# Patient Record
Sex: Female | Born: 1970 | Race: White | Hispanic: Yes | Marital: Married | Smoking: Never smoker
Health system: Southern US, Community
[De-identification: ages and names within clinical notes are randomized; demographics above are authoritative.]

## PROBLEM LIST (undated history)

## (undated) DIAGNOSIS — Z8742 Personal history of other diseases of the female genital tract: Secondary | ICD-10-CM

## (undated) DIAGNOSIS — K7581 Nonalcoholic steatohepatitis (NASH): Secondary | ICD-10-CM

## (undated) DIAGNOSIS — F32A Depression, unspecified: Secondary | ICD-10-CM

## (undated) DIAGNOSIS — R87612 Low grade squamous intraepithelial lesion on cytologic smear of cervix (LGSIL): Secondary | ICD-10-CM

## (undated) DIAGNOSIS — F329 Major depressive disorder, single episode, unspecified: Secondary | ICD-10-CM

## (undated) DIAGNOSIS — N871 Moderate cervical dysplasia: Secondary | ICD-10-CM

## (undated) HISTORY — PX: TUBAL LIGATION: SHX77

## (undated) HISTORY — DX: Moderate cervical dysplasia: N87.1

## (undated) HISTORY — DX: Personal history of other diseases of the female genital tract: Z87.42

## (undated) HISTORY — DX: Nonalcoholic steatohepatitis (NASH): K75.81

## (undated) HISTORY — PX: TONSILLECTOMY AND ADENOIDECTOMY: SUR1326

## (undated) HISTORY — DX: Depression, unspecified: F32.A

## (undated) HISTORY — DX: Major depressive disorder, single episode, unspecified: F32.9

## (undated) HISTORY — PX: CERVICAL BIOPSY  W/ LOOP ELECTRODE EXCISION: SUR135

## (undated) HISTORY — DX: Low grade squamous intraepithelial lesion on cytologic smear of cervix (LGSIL): R87.612

---

## 2012-05-07 ENCOUNTER — Other Ambulatory Visit: Payer: Self-pay | Admitting: Obstetrics & Gynecology

## 2012-05-07 ENCOUNTER — Encounter: Payer: Self-pay | Admitting: Obstetrics and Gynecology

## 2012-05-07 DIAGNOSIS — Z1231 Encounter for screening mammogram for malignant neoplasm of breast: Secondary | ICD-10-CM

## 2012-06-03 ENCOUNTER — Encounter: Payer: Self-pay | Admitting: Obstetrics & Gynecology

## 2012-06-03 ENCOUNTER — Ambulatory Visit (HOSPITAL_COMMUNITY)
Admission: RE | Admit: 2012-06-03 | Discharge: 2012-06-03 | Disposition: A | Discharge status: Home / Self Care | Payer: Self-pay | Source: Ambulatory Visit | Attending: Obstetrics & Gynecology | Admitting: Obstetrics & Gynecology

## 2012-06-03 DIAGNOSIS — Z1231 Encounter for screening mammogram for malignant neoplasm of breast: Secondary | ICD-10-CM | POA: Insufficient documentation

## 2012-06-24 ENCOUNTER — Ambulatory Visit (INDEPENDENT_AMBULATORY_CARE_PROVIDER_SITE_OTHER): Payer: Self-pay | Admitting: Obstetrics & Gynecology

## 2012-06-24 ENCOUNTER — Encounter: Payer: Self-pay | Admitting: Obstetrics & Gynecology

## 2012-06-24 VITALS — BP 147/89 | HR 88 | Temp 99.0°F | Resp 12 | Ht 62.0 in | Wt 182.0 lb

## 2012-06-24 DIAGNOSIS — Z01419 Encounter for gynecological examination (general) (routine) without abnormal findings: Secondary | ICD-10-CM

## 2012-06-24 DIAGNOSIS — Z0000 Encounter for general adult medical examination without abnormal findings: Secondary | ICD-10-CM

## 2012-06-24 NOTE — Progress Notes (Signed)
Patient ID: Latoya [name], female   DOB: 11/27/1970, 41 y.o.   MRN: [id number] Subjective:    Latoya [name] is a 41 y.o. female who presents for an annual exam. She complains of PMDD symptoms. She denies SI or HI. The patient is sexually active. GYN screening history: last pap: was normal. The patient wears seatbelts: yes. The patient participates in regular exercise: not asked. Has the patient ever been transfused or tattooed?: no. The patient reports that there is not domestic violence in her life.   Menstrual History: OB History    Grav Para Term Preterm Abortions TAB SAB Ect Mult Living                  Menarche age: 24 Patient's last menstrual period was 05/31/2012.    The following portions of the patient's history were reviewed and updated as appropriate: allergies, current medications, past family history, past medical history, past social history, past surgical history and problem list.  Review of Systems A comprehensive review of systems was negative.  Her mammogram was in 8/13   Objective:    BP 147/89  Pulse 88  Temp 99 F (37.2 C) (Oral)  Resp 12  Ht 5' 2"  (1.575 m)  Wt 182 lb (82.555 kg)  BMI 33.29 kg/m2  LMP 05/31/2012  General Appearance:    Alert, cooperative, no distress, appears stated age  Head:    Normocephalic, without obvious abnormality, atraumatic  Eyes:    PERRL, conjunctiva/corneas clear, EOM's intact, fundi    benign, both eyes  Ears:    Normal TM's and external ear canals, both ears  Nose:   Nares normal, septum midline, mucosa normal, no drainage    or sinus tenderness  Throat:   Lips, mucosa, and tongue normal; teeth and gums normal  Neck:   Supple, symmetrical, trachea midline, no adenopathy;    thyroid:  no enlargement/tenderness/nodules; no carotid   bruit or JVD  Back:     Symmetric, no curvature, ROM normal, no CVA tenderness  Lungs:     Clear to auscultation bilaterally, respirations unlabored  Chest Wall:    No tenderness or deformity   Heart:    Regular rate and rhythm, S1 and S2 normal, no murmur, rub   or gallop  Breast Exam:    No tenderness, masses, or nipple abnormality  Abdomen:     Soft, non-tender, bowel sounds active all four quadrants,    no masses, no organomegaly  Genitalia:    Normal female without lesion, discharge or tenderness, NSSA, NT, no adnexal masses     Extremities:   Extremities normal, atraumatic, no cyanosis or edema  Pulses:   2+ and symmetric all extremities  Skin:   Skin color, texture, turgor normal, no rashes or lesions  Lymph nodes:   Cervical, supraclavicular, and axillary nodes normal  Neurologic:   CNII-XII intact, normal strength, sensation and reflexes    throughout  .    Assessment:    Healthy female exam.  PMDD   Plan:       Prozac 20 mg q AM pap RTC 2 months for follow up.

## 2012-07-01 ENCOUNTER — Encounter: Payer: Self-pay | Admitting: Obstetrics & Gynecology

## 2012-07-01 DIAGNOSIS — R87612 Low grade squamous intraepithelial lesion on cytologic smear of cervix (LGSIL): Secondary | ICD-10-CM | POA: Insufficient documentation

## 2012-07-01 HISTORY — DX: Low grade squamous intraepithelial lesion on cytologic smear of cervix (LGSIL): R87.612

## 2012-07-02 ENCOUNTER — Telehealth: Payer: Self-pay | Admitting: *Deleted

## 2012-07-02 NOTE — Telephone Encounter (Signed)
Called pt w/[employer] interpreter # [phone].  I informed her of abnormal Pap result and need for colpo. I explained the procedure in detail and answered her questions. She agreed to appt and voiced understanding of all information given.

## 2012-07-02 NOTE — Telephone Encounter (Signed)
Message copied by Langston Reusing on Thu Jul 02, 2012  4:30 PM ------      Message from: Moshe Cipro L      Created: Wed Jul 01, 2012  1:08 PM       Thursday October 10, at 1:45 pm                  ----- Message -----         From: Ronnell Freshwater Katrece Roediger, RN         Sent: 07/01/2012  11:05 AM           To: [facility] Admin Pool            Please schedule colpo and send back to clinical pool. We will call pt. Thanks       ----- Message -----         From: Emily Filbert, MD         Sent: 07/01/2012  10:23 AM           To: [facility] Clinical Pool            She needs to have a colposcopy.

## 2012-07-16 ENCOUNTER — Ambulatory Visit (INDEPENDENT_AMBULATORY_CARE_PROVIDER_SITE_OTHER): Payer: Self-pay | Admitting: Obstetrics & Gynecology

## 2012-07-16 ENCOUNTER — Other Ambulatory Visit (HOSPITAL_COMMUNITY)
Admission: RE | Admit: 2012-07-16 | Discharge: 2012-07-16 | Disposition: A | Discharge status: Home / Self Care | Payer: Self-pay | Source: Ambulatory Visit | Attending: Obstetrics & Gynecology | Admitting: Obstetrics & Gynecology

## 2012-07-16 VITALS — BP 133/84 | HR 101 | Temp 98.1°F | Ht 62.6 in | Wt 184.2 lb

## 2012-07-16 DIAGNOSIS — N871 Moderate cervical dysplasia: Secondary | ICD-10-CM | POA: Insufficient documentation

## 2012-07-16 DIAGNOSIS — Z01812 Encounter for preprocedural laboratory examination: Secondary | ICD-10-CM

## 2012-07-16 DIAGNOSIS — IMO0002 Reserved for concepts with insufficient information to code with codable children: Secondary | ICD-10-CM

## 2012-07-16 DIAGNOSIS — R6889 Other general symptoms and signs: Secondary | ICD-10-CM

## 2012-07-16 NOTE — Progress Notes (Signed)
Subjective:     Patient ID: Latoya [name], female   DOB: 01-19-1971, 41 y.o.   MRN: [id number]  HPI  Pt with h/o abnormal PAP.  Pt presents for colpo.   Review of Systems     Objective:   Physical ExamBP 133/84  Pulse 101  Temp 98.1 F (36.7 C) (Oral)  Ht 5' 2.6" (1.59 m)  Wt 184 lb 3.2 oz (83.553 kg)  BMI 33.05 kg/m2  LMP 06/26/2012  Patient given informed consent, signed copy in the chart, time out was performed.  Placed in lithotomy position. Cervix viewed with speculum and colposcope after application of acetic acid.   Colposcopy adequate?  yes Acetowhite lesions?yes Punctation?yes @ 6:00 Mosaicism? Yes @6 :00 Abnormal vasculature?  Yes @ 6:00 Biopsies?Yes @ 6:00 ECC?no   06/24/12  GYNECOLOGIC CYTOLOGY REPORT Adequacy Reason Satisfactory for evaluation, endocervical/transformation zone component PRESENT. Diagnosis LOW GRADE SQUAMOUS INTRAEPITHELIAL LESION: CIN-1/ HPV (LSIL); HOWEVER, THERE ARE A FEW CELLS SUGGESTIVE OF A HIGHER GRADE LESION.     Assessment:     LGSIL of PAP.  Colpo most consistent for mod to high grade at 6:00. Patient was given post procedure instructions.  She will return for LEEP if bx warrant     Plan:     F/u results of cervical bx.  If mod to high grade rec LEEP  Marco Adelson L. Harraway-Smith, M.D., Cherlynn June

## 2012-07-16 NOTE — Patient Instructions (Signed)
Procedimiento de escisin electroquirrgica con asa (Loop Electrosurgical Excision Procedure) El procedimiento de escisin electroquirrgica con asa es la extirpacin de una porcin de la parte inferior del tero (cuello). El procedimento se realiza cuando hay cambios significativamente anormales en las clulas del cuello del tero. Estos cambios pueden causar cncer si se dejan sin tratar.   El procedimiento mismo slo demora algunos minutos. Generalmente se Personal assistant del mdico. Se considera un procedimiento seguro para aquellas mujeres que desean o tratan de quedar embarazadas. Solo bajo raras circunstancias este procedimiento se realiza estando embarazada.  INFORME A SU MDICO:   Si est embarazada o no tuvo el ltimo perodo menstrual.  Alergias a alimentos o medicamentos.  Todos los [employer] [place], incluyendo vitaminas, hierbas, gotas oftlmicas, medicamentos de [place] y Proofreader.  Uso de corticoides (por va oral o cremas).  Problemas anteriores debido a anestsicos o a medicamentos que [employer] sensibilidad.  Cirugas ginecolgicas previas.  Antecedentes de hemorragias o cogulos sanguneos.  Infecciones recientes o actuales en la vagina (herpes, enfermedades de transmisin sexual).  Otros problemas de [place]. [place].  Infecciones.  Lesiones en la vagina, la vejiga o el recto.  Obstruccin rara en la abertura del cuello que causa problemas durante la menstruacin (estenosis cervical). ANTES DEL PROCEDIMIENTO   No tome aspirina ni anticoagulantes durante la semana previa al procedimiento, o segn le hayan indicado.  Consuma una comida ligera antes del procedimiento.  Consulte a su mdico si debe cambiar o suspender los medicamentos que toma habitualmente.  Le administrarn un analgsico 1  2 horas antes del procedimiento. PROCEDIMIENTO   Se coloca un instrumento (espculo) en la vagina. Esto le permite  al mdico observar el cuello.  Se aplica tintura de yodo para encontrar la zona de las clulas anormales.  Se inyecta un medicamento para adormecer el cuello (anestsico local).   Se pasa electricidad a travs de una delgada asa de alambre que luego se [place] para extirpar (cauterizar) un pequeo segmento de la zona afectada.  Se utiliza una ligera electrocauterizacin para sellar los vasos sanguneos pequeos y Engineer, water.  Aplicarn una pasta en la zona cauterizada para prevenir el sangrado.  La muestra de tejido se enva al laboratorio. Luego, se examina en el microscopio. DESPUS DEL PROCEDIMIENTO   Pdale a alguna persona que la lleve hasta su casa.  Puede sentir un clico moderado a suave.  Puede notar una secrecin vaginal negra por la pasta usada para prevenir el sangrado. Esto es normal.  Observe si tiene un sangrado excesivo. Esto requiere atencin mdica inmediata.  Consulte con su mdico la fecha en que los resultados estarn disponibles. Asegrese de [employer]. Document Released: 06/05/2011 Document Revised: 12/16/2011 [facility] Patient Information 2013 [place].

## 2012-07-22 ENCOUNTER — Telehealth: Payer: Self-pay

## 2012-07-22 NOTE — Telephone Encounter (Signed)
Message copied by Michel Harrow on Wed Jul 22, 2012  4:35 PM ------      Message from: Lavonia Drafts      Created: Wed Jul 22, 2012  1:22 PM       Please schedule pt for LEEP with me.              Thx,      clh-S

## 2012-07-22 NOTE — Telephone Encounter (Signed)
Called pt with [facility] and informed pt of scheduled appt for LEEP procedure/evaluation on 08/10/12 @ 2 pm. Pt stated wanted to know what the procedure was I explained and informed her that she would get an evaluation before the procedure.  Pt stated understanding and had no further questions.

## 2012-08-10 ENCOUNTER — Other Ambulatory Visit (HOSPITAL_COMMUNITY)
Admission: RE | Admit: 2012-08-10 | Discharge: 2012-08-10 | Disposition: A | Discharge status: Home / Self Care | Payer: Self-pay | Source: Ambulatory Visit | Attending: Obstetrics & Gynecology | Admitting: Obstetrics & Gynecology

## 2012-08-10 ENCOUNTER — Encounter: Payer: Self-pay | Admitting: Obstetrics & Gynecology

## 2012-08-10 ENCOUNTER — Ambulatory Visit (INDEPENDENT_AMBULATORY_CARE_PROVIDER_SITE_OTHER): Payer: Self-pay | Admitting: Obstetrics & Gynecology

## 2012-08-10 VITALS — BP 130/86 | HR 94 | Temp 98.5°F | Ht 62.25 in | Wt 178.2 lb

## 2012-08-10 DIAGNOSIS — N871 Moderate cervical dysplasia: Secondary | ICD-10-CM

## 2012-08-10 DIAGNOSIS — N870 Mild cervical dysplasia: Secondary | ICD-10-CM | POA: Insufficient documentation

## 2012-08-10 DIAGNOSIS — Z01812 Encounter for preprocedural laboratory examination: Secondary | ICD-10-CM

## 2012-08-10 HISTORY — DX: Moderate cervical dysplasia: N87.1

## 2012-08-10 MED ORDER — FLUOXETINE HCL 20 MG PO CAPS: 20.0000 mg | ORAL_CAPSULE | Freq: Every day | ORAL | Status: DC

## 2012-08-10 NOTE — Progress Notes (Signed)
Patient ID: Latoya [name], female   DOB: 1971-01-07, 41 y.o.   MRN: [id number] Patient identified, informed consent obtained, signed copy in chart, time out performed.  Pap smear and colposcopy reviewed.   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  Cervix visualized. Paracervical block placed. Hurricaine spray applied  1.5 cm Fisher  size loop used to remove cone of cervix using blend of cut and cautery on LEEP machine.  Edges/Base cauterized with Ball.  Monsel's solution used for hemostasis.  Patient tolerated procedure well. Specimen to pathology. Patient given post procedure instructions.  Follow up in 4 months for repeat pap or as needed.  RTC 2 weeks review result  Delise Simenson 08/10/2012 2:36 PM

## 2012-08-10 NOTE — Patient Instructions (Signed)
Procedimiento de escisin electroquirrgica con asa - Cuidados posteriores  (Loop Electrosurgical Excision Procedure, Care After) Siga estas instrucciones durante las prximas semanas. Estas indicaciones le proporcionan informacin general acerca de cmo deber cuidarse despus del procedimiento. El mdico tambin podr darle instrucciones especficas. El tratamiento ha sido planificado segn las prcticas mdicas actuales, pero en algunos casos pueden ocurrir problemas. Comunquese con el mdico si tiene algn problema o tiene preguntas despus del procedimiento.  INSTRUCCIONES PARA EL CUIDADO EN EL HOGAR   No use tampones, no se d duchas vaginales ni tenga relaciones sexuales durante 2 semanas, o segn lo que le indique su mdico.   Comience con las actividades habituales si no tiene o tiene mnimo de clicos y Teacher, music, excepto que el mdico le indique lo contrario.   Tmese la temperatura si se siente enfermo. Anote la temperatura en un papel e informe a su mdico que tiene fiebre.   Tome todos los medicamentos segn le indic su mdico.   Cumpla con todas las visitas de control y los papanicolau, segn le indique su mdico.  SOLICITE ATENCIN MDICA DE INMEDIATO SI:   Tiene un sangrado ms abundante o que dura ms que el ciclo menstrual normal.   Tiene un sangrado de color rojo brillante.   Elimina cogulos de [place].   Tiene fiebre.   Siente clicos o el dolor no se alivia con Conservation officer, nature.   Siente dolor abdominal que no parece estar relacionado con la misma zona en que sinti los clicos y Conservation officer, historic buildings.   Se siente mareada, dbil o se desmaya.   Comienza a Education officer, environmental al orinar u [employer]   Tiene una secrecin vaginal con mal olor.  ASEGRESE DE QUE:   Comprende estas instrucciones.   Controlar su enfermedad.   Solicitar ayuda de inmediato si no mejora o si empeora.  Document Released: 06/06/2011 Document Revised: 09/12/2011 [facility] Patient Information 2012  [place].

## 2012-08-10 NOTE — Addendum Note (Signed)
Addended by: Woodroe Mode on: 08/10/2012 02:49 PM   Modules accepted: Orders

## 2012-08-24 ENCOUNTER — Encounter: Payer: Self-pay | Admitting: Obstetrics & Gynecology

## 2012-08-24 ENCOUNTER — Ambulatory Visit (INDEPENDENT_AMBULATORY_CARE_PROVIDER_SITE_OTHER): Payer: Self-pay | Admitting: Obstetrics & Gynecology

## 2012-08-24 VITALS — BP 133/81 | HR 87 | Temp 97.0°F | Ht 62.0 in | Wt 179.1 lb

## 2012-08-24 DIAGNOSIS — N871 Moderate cervical dysplasia: Secondary | ICD-10-CM

## 2012-08-24 NOTE — Patient Instructions (Signed)
Cervical Dysplasia Cervical dysplasia is a condition in which a woman has abnormal changes in the cells of her cervix. The cervix is the opening to the uterus (womb) between the vagina and the uterus. These changes are called cervical dysplasia and may be the first signs of cervical cancer. These cells can be taken from the cervix during a Pap test and then looked at under a microscope. With early detection, treatment, and close follow-up care, nearly all cervical dysplasia can be cured. If untreated, the mild to moderate stages of dysplasia often grow more severe.  RISK FACTORS  The following increase the risk for cervical dysplasia.  Having had a sexually transmitted disease, including:  Chlamydia.  Human papilloma virus (HPV).  Becoming sexually active before age 43.  Having had more than 1 sexual partner.  Not using protection, such as condoms, during sexual intercourse, especially with new sexual partners.  Having had cancer of the vagina or vulva.  Having a sexual partner whose previous partner had cancer of the cervix or cervical dysplasia.  Having a sexual partner who has or has had cancer of the penis.  Having a weakened immune system (HIV, organ transplant).  Being the daughter of a woman who took DES (diethylstilbestrol) during pregnancy.  A history of cervical cancer in a woman's sister or mother.  Smoking.  Having had an abnormal Pap test in the past. SYMPTOMS  There are usually no symptoms. If there are symptoms, they may be vague such as:  Abnormal vaginal discharge.  Bleeding between periods or following intercourse.  Bleeding during menopause.  Pain on intercourse (dyspareunia). DIAGNOSIS   The Pap test is the best way of detecting abnormalities of the cervix.  Biopsy (removing a piece of tissue to look at under the microscope) of the cervix when the Pap test is abnormal or when the Pap test is normal, but the cervix looks abnormal. TREATMENT    Catching and treating the changes early with Pap tests can prevent cervical cancer.  Cryotherapy freezes the abnormal cells with a steel tip instrument.  A laser can be used to remove the abnormal cells.  Loop electrocautery excision procedure (LEEP). This procedure uses a heated electrical loop to remove a cone-like portion of the cervix, including the cervical canal.  For more serious cases of cervical dysplasia, the abnormal tissue may be removed surgically by:  A cone biopsy (by cold knife, laser or LEEP). A procedure in which a portion of the center of the cervix with the cervical canal is removed.  The uterus and cervix are removed (hysterectomy). Your caregiver will advise you regarding the need and timing of Pap tests in your follow-up. Women who have been treated for dysplasia should be closely followed with pelvic exams and Pap tests. During the first year following treatment of cervical dysplasia, Pap tests should be done every 3 to 4 months. In the second year, the schedule is every 6 months, or as recommended by your caregiver. See your caregiver for new or worsening problems. HOME CARE INSTRUCTIONS   Follow the instructions and recommendations of your caregiver regarding medicines and follow-up appointments.  Only take over-the-counter or prescription medicines for pain or discomfort as directed by your caregiver.  Cramping and pelvic discomfort may follow cryotherapy. It is not abnormal to have watery discharge for several weeks after.  Laser, cone surgery, cryotherapy or LEEP can cause a bad smelling vaginal discharge. It may also cause vaginal bleeding for a couple weeks following the procedure. The  discharge may be black from the paste used to control bleeding from the cone site. This is normal.  Do not use tampons, have sexual intercourse or douche until your caregiver says it is okay. SEEK MEDICAL CARE IF:   You develop genital warts.  You need a prescription for  pain medicine following your treatment. SEEK IMMEDIATE MEDICAL CARE IF:   Your bleeding is heavier than a normal menstrual period.  You develop bright red bleeding, especially if you have blood clots.  You have a fever.  You have increasing cramps or pain not relieved with medicine.  You are lightheaded, unusually weak, or have fainting spells.  You have abnormal vaginal discharge.  You develop abdominal pain. PREVENTION   The surest way to prevent cervical dysplasia is to abstain from sexual intercourse.  Practice safe sex, use condoms and have only one sex partner who does not have other sex partners.  A Pap test is done to screen for cervical cancer.  The first Pap test should be done at age 32.  Between ages 66 and 78, Pap tests are repeated every 2 years.  Beginning at age 48, you are advised to have a Pap test every 3 years as long as your past 3 Pap tests have been normal.  Some women have medical problems that increase the chance of getting cervical cancer. Talk to your caregiver about these problems. It is especially important to talk to your caregiver if a new problem develops soon after your last Pap test. In these cases, your caregiver may recommend more frequent screening and Pap tests.  The above recommendations are the same for women who have or have not gotten the vaccine for HPV (Human Papillomavirus).  If you had a hysterectomy for a problem that was not a cancer or a condition that could lead to cancer, then you no longer need Pap tests. However, even if you no longer need a Pap test, a regular exam is a good idea to make sure no other problems are starting.   If you are between ages 80 and 5, and you have had normal Pap tests going back 10 years, you no longer need Pap tests. However, even if you no longer need a Pap test, a regular exam is a good idea to make sure no other problems are starting.   If you have had past treatment for cervical cancer or a  condition that could lead to cancer, you need Pap tests and screening for cancer for at least 20 years after your treatment.  If Pap tests have been discontinued, risk factors (such as a new sexual partner) need to be re-assessed to determine if screening should be resumed.  Some women may need screenings more often if they are at high risk for cervical cancer.  Your caregiver may do additional tests including:  Colposcopy. A procedure in which a special microscope magnifies the cells and allows the provider to closely examine the cervix, vagina, and vulva.  Biopsy. A small tissue sample is taken from the cervix, vagina or vulva. This is generally done in your caregivers office.  A cone biopsy (cold knife or laser). A large tissue sample is taken from the cervix. This procedure is usually done in an operating room under a general anesthetic. The cone often removes all abnormal tissue and so may also complete the treatment.  LEEP, also removing a circular portion of the cervix and is done in a doctors office under a local anesthetic.  Now  there is a vaccine, Gardasil, that was developed to prevent the HPV'S that can cause cancer of the cervix and genital warts. It is recommended for females ages 27 to 79. It should not be given to pregnant women until more is known about its effects on the fetus. Not all cancers of the cervix are caused by the HPV. Routine gynecology exams and Pap tests should continue as recommended by your caregiver. Document Released: 09/23/2005 Document Revised: 12/16/2011 Document Reviewed: 09/14/2008 [facility] Patient Information 2013 [place].

## 2012-08-24 NOTE — Progress Notes (Signed)
  [id number] Patient's last menstrual period was 07/24/2012. 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  RTC for pap and cotesting   Latoya [name] 08/24/2012 1:32 PM

## 2013-01-19 ENCOUNTER — Other Ambulatory Visit: Payer: Self-pay | Admitting: Specialist

## 2013-01-19 ENCOUNTER — Ambulatory Visit
Admission: RE | Admit: 2013-01-19 | Discharge: 2013-01-19 | Disposition: A | Discharge status: Home / Self Care | Payer: No Typology Code available for payment source | Source: Ambulatory Visit | Attending: Specialist | Admitting: Specialist

## 2013-01-19 DIAGNOSIS — R7611 Nonspecific reaction to tuberculin skin test without active tuberculosis: Secondary | ICD-10-CM

## 2013-07-28 ENCOUNTER — Other Ambulatory Visit: Payer: Self-pay | Admitting: Obstetrics & Gynecology

## 2013-08-12 ENCOUNTER — Other Ambulatory Visit: Payer: Self-pay | Admitting: Obstetrics and Gynecology

## 2013-08-12 DIAGNOSIS — Z1231 Encounter for screening mammogram for malignant neoplasm of breast: Secondary | ICD-10-CM

## 2013-09-07 ENCOUNTER — Ambulatory Visit (HOSPITAL_COMMUNITY)
Admission: RE | Admit: 2013-09-07 | Discharge: 2013-09-07 | Disposition: A | Discharge status: Home / Self Care | Payer: Self-pay | Source: Ambulatory Visit | Attending: Obstetrics and Gynecology | Admitting: Obstetrics and Gynecology

## 2013-09-07 ENCOUNTER — Encounter (HOSPITAL_COMMUNITY): Payer: Self-pay

## 2013-09-07 ENCOUNTER — Other Ambulatory Visit: Payer: Self-pay | Admitting: Obstetrics and Gynecology

## 2013-09-07 VITALS — BP 112/78 | Temp 98.3°F | Ht 65.0 in | Wt 188.4 lb

## 2013-09-07 DIAGNOSIS — N6314 Unspecified lump in the right breast, lower inner quadrant: Secondary | ICD-10-CM

## 2013-09-07 DIAGNOSIS — Z01419 Encounter for gynecological examination (general) (routine) without abnormal findings: Secondary | ICD-10-CM

## 2013-09-07 DIAGNOSIS — Z1231 Encounter for screening mammogram for malignant neoplasm of breast: Secondary | ICD-10-CM

## 2013-09-07 NOTE — Patient Instructions (Signed)
Taught Latoya [name] how to perform BSE and gave educational materials to take home. Let patient know that her next Pap smear will be due based on the result to today's Pap smear. Referred patient to the [facility] for diagnostic mammogram and possible right breast ultrasound. Appointment scheduled for Friday, September 17, 2013 at 0915. Patient aware of appointment and will be there. Let patient know will follow up with her within the next couple weeks with results for Pap smear by phone. Latoya [name] verbalized understanding.  Latoya [name], Arvil Chaco, RN 12:37 PM

## 2013-09-07 NOTE — Progress Notes (Signed)
No complaints today.  Pap Smear:    Pap smear completed today. Patients last Pap smear was 06/24/2012 at the [facility] and LSIL with a few cells suggestive of a higher grade lesion. Patient had a colposcopy and LEEP for follow up of abnormal Pap smear. Per patient that is the only abnormal Pap smear she has had. Pap smear result above is in EPIC.  Physical exam: Breasts Breasts symmetrical. No skin abnormalities bilateral breasts. No nipple retraction bilateral breasts. No nipple discharge bilateral breasts. No lymphadenopathy. No lumps palpated left breast. Palpated a pea sized lump within the right breast at 4 o'clock 9 cm from the nipple. Patient complained of tenderness when palpated lump. [facility] to [facility] for [facility] and possible right breast ultrasound. Appointment scheduled for Friday, September 17, 2013 at 0915.      Pelvic/Bimanual   Ext Genitalia No lesions, no swelling and no discharge observed on external genitalia.         Vagina Vagina pink and normal texture. No lesions or discharge observed in vagina.          Cervix Cervix is present. Cervix pink and of normal texture. No discharge observed.     Uterus Uterus is present and palpable. Uterus in normal position and normal size.        Adnexae Bilateral ovaries present and palpable. No tenderness on palpation.          Rectovaginal No rectal exam completed today since patient had no rectal complaints. No skin abnormalities observed on exam.

## 2013-09-13 ENCOUNTER — Telehealth (HOSPITAL_COMMUNITY): Payer: Self-pay | Admitting: *Deleted

## 2013-09-13 NOTE — Telephone Encounter (Signed)
Telephoned patient with interpreter Lavon Paganini. Discussed negative pap smear results. Next pap smear due in years. Patient voiced understanding.

## 2013-09-17 ENCOUNTER — Other Ambulatory Visit: Payer: Self-pay

## 2013-10-21 ENCOUNTER — Telehealth (HOSPITAL_COMMUNITY): Payer: Self-pay | Admitting: *Deleted

## 2013-10-21 NOTE — Telephone Encounter (Signed)
Telephoned patient with interpreter Lavon Paganini. Patient will call and reschedule mammogram with [facility].

## 2013-11-03 ENCOUNTER — Ambulatory Visit
Admission: RE | Admit: 2013-11-03 | Discharge: 2013-11-03 | Disposition: A | Discharge status: Home / Self Care | Payer: No Typology Code available for payment source | Source: Ambulatory Visit | Attending: Obstetrics and Gynecology | Admitting: Obstetrics and Gynecology

## 2013-11-03 DIAGNOSIS — N6314 Unspecified lump in the right breast, lower inner quadrant: Secondary | ICD-10-CM

## 2014-08-08 ENCOUNTER — Encounter (HOSPITAL_COMMUNITY): Payer: Self-pay

## 2014-10-07 DIAGNOSIS — Z8742 Personal history of other diseases of the female genital tract: Secondary | ICD-10-CM

## 2014-10-07 HISTORY — DX: Personal history of other diseases of the female genital tract: Z87.42

## 2015-06-20 ENCOUNTER — Other Ambulatory Visit (HOSPITAL_COMMUNITY): Payer: Self-pay | Admitting: *Deleted

## 2015-06-20 DIAGNOSIS — N6310 Unspecified lump in the right breast, unspecified quadrant: Secondary | ICD-10-CM

## 2015-06-22 ENCOUNTER — Ambulatory Visit (HOSPITAL_COMMUNITY)
Admission: RE | Admit: 2015-06-22 | Discharge: 2015-06-22 | Disposition: A | Discharge status: Home / Self Care | Payer: Self-pay | Source: Ambulatory Visit | Attending: Obstetrics and Gynecology | Admitting: Obstetrics and Gynecology

## 2015-06-22 ENCOUNTER — Encounter (HOSPITAL_COMMUNITY): Payer: Self-pay

## 2015-06-22 VITALS — BP 120/74 | Temp 98.2°F | Ht 60.0 in | Wt 190.0 lb

## 2015-06-22 DIAGNOSIS — Z01419 Encounter for gynecological examination (general) (routine) without abnormal findings: Secondary | ICD-10-CM

## 2015-06-22 NOTE — Progress Notes (Signed)
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 is a 44 y.o. female who presents to the [facility] today for clinical breast exam and routine gynecological exam.  Her last mammogram was 10/2013, with recommendations to follow-up in 6 months for a right breast, 9:30 mass that was likely a cyst.  She did not follow-up in 6 months from 10/2013, but is here today for further evaluation.  She has no family history of breast cancer.  She has no breast complaints today. 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  In 08/2012, she had a LEEP, which showed CIN-I.  Her most recent pap was in 09/2013 and was negative and HPV (-).   She has no GYN complaints today.   REVIEW OF SYSTEMS:  Denies any breast tenderness, skin changes, nipple discharge/inversion, or lumps.  Denies any pelvic pain, pressure, or abnormal vaginal bleeding.    ALLERGIES: No Known Allergies  CURRENT MEDICATIONS:  Current Outpatient Prescriptions on File Prior to Encounter  Medication Sig Dispense Refill  . FLUoxetine (PROZAC) 20 MG capsule Take 1 capsule (20 mg total) by mouth daily. 30 capsule 2  . Multiple Vitamins-Minerals (MULTIVITAMIN WITH MINERALS) tablet Take 1 tablet by mouth daily.     No current facility-administered medications on file prior to encounter.     PHYSICAL EXAM:  Vitals:  Filed Vitals:   06/22/15 1504  BP: 120/74  Temp: 98.2 F (36.8 C)    General: Well-nourished, well-appearing female in no acute distress.  She is unaccompanied in clinic today.  Rolena Infante, LPN and Tamsen Snider, Spanish language interpreter were present during physical exam for this patient.  Breasts: Bilateral breasts exposed and observed with patient standing (arms at side, arms on hips, arms on hips flexed  forward, and arms over head).  No gross abnormalities including breast skin puckering or dimpling noted on observation.  Breasts symmetrical without evidence of skin redness, thickening, or peau d'orange appearance. No nipple retraction or nipple discharge noted bilaterally.  No breast nodularity palpated in bilateral breasts.  Right breast: The previous area of identified concern on mammogram (9:30, 6 cm from nipple) has no palpable abnormality on exam today.  Axillary lymph nodes: No axillary lymphadenopathy bilaterally.   GU:  -External genitalia: No lesions, swelling, or discharge. Even hair distribution as expected.  -Vagina: Pink, moist. No lesions or discharge noted in vaginal canal.  -Cervix: Cervix pink with evidence of previous LEEP. Cervical os patent. Small amount of clear cervical discharge noted.  -Uterus: Bimanual exam demonstrates no uterine mass or tenderness on palpation. Uterus in normal position and normal size.  -Adnexae: Bimanual exam demonstrates no ovarian masses or tenderness on palpation.  -Rectovaginal: No lesions noted to rectum. Rectal tone intact.  No masses or nodularity palpated by bimanual rectovaginal exam.     ASSESSMENT & PLAN:   1. Breast cancer screening: [name] has no palpable breast abnormalities on her clinical breast exam today.  She will receive her diagnostic mammogram as scheduled.  She will be contacted by the imaging center for results of the mammogram. She was given instructions and educational materials regarding breast self-awareness. [name] is aware of this plan and agrees with it.   2. Cervical cancer screening: [name] has a normal pelvic exam today.  A pap smear was completed today per protocol.  She tolerated the procedure without complaints.  She will be contacted by one of our [place] in the next few weeks to review the results of the pap smear with the patient.    [name] was encouraged to ask questions and all questions were answered to her satisfaction.    Mike Craze, NP [employer]  [phone]

## 2015-06-27 ENCOUNTER — Other Ambulatory Visit (HOSPITAL_COMMUNITY): Payer: Self-pay | Admitting: Obstetrics and Gynecology

## 2015-06-27 ENCOUNTER — Ambulatory Visit
Admission: RE | Admit: 2015-06-27 | Discharge: 2015-06-27 | Disposition: A | Discharge status: Home / Self Care | Payer: No Typology Code available for payment source | Source: Ambulatory Visit | Attending: Obstetrics and Gynecology | Admitting: Obstetrics and Gynecology

## 2015-06-27 DIAGNOSIS — N6310 Unspecified lump in the right breast, unspecified quadrant: Secondary | ICD-10-CM

## 2015-06-28 LAB — CYTOLOGY - PAP

## 2015-07-18 ENCOUNTER — Telehealth (HOSPITAL_COMMUNITY): Payer: Self-pay | Admitting: *Deleted

## 2015-07-18 NOTE — Telephone Encounter (Signed)
Telephoned patient at home # and discussed negative pap smear results. HPV was negative. Next pap smear due in one year due to history of abnormal pap smears. Patient voiced understanding. Used interpreter Lavon Paganini.

## 2015-12-06 ENCOUNTER — Telehealth: Payer: Self-pay

## 2015-12-06 NOTE — Telephone Encounter (Signed)
Could not reach. Calling to see if wants to come to [facility]

## 2015-12-06 NOTE — Telephone Encounter (Signed)
Called per interpreter Anastasio Auerbach to see if interested in [facility]. Left message.

## 2015-12-13 ENCOUNTER — Encounter: Payer: Self-pay | Admitting: Internal Medicine

## 2015-12-13 ENCOUNTER — Ambulatory Visit (INDEPENDENT_AMBULATORY_CARE_PROVIDER_SITE_OTHER): Payer: Self-pay | Admitting: Internal Medicine

## 2015-12-13 VITALS — BP 126/80 | HR 86 | Ht 62.5 in | Wt 196.0 lb

## 2015-12-13 DIAGNOSIS — R300 Dysuria: Secondary | ICD-10-CM

## 2015-12-13 LAB — POCT URINALYSIS DIPSTICK
BILIRUBIN UA: NEGATIVE
GLUCOSE UA: NEGATIVE
KETONES UA: NEGATIVE
NITRITE UA: NEGATIVE
Protein, UA: NEGATIVE
Spec Grav, UA: 1.01
Urobilinogen, UA: 0.2
pH, UA: 5

## 2015-12-13 NOTE — Progress Notes (Signed)
   Subjective:    Patient ID: Latoya [name], female    DOB: 02-Jan-1971, 45 y.o.   MRN: [id number]  HPI  Dysuria since about 1 1/2 weeks ago.  Generally, only at end of urination.  Started her period about the same time.  Has never had this symptoms with period before.  No suprapubic pain, but feels bloated.  No flank tenderness, but does have bilateral low back pain. + Frequency Has had urine incontinence, but that happens with her periods. Thinks she may be having a fever at times. Has had nausea, but no vomiting. +Headaches--has also had these with periods last couple of months. Has loose stools with her periods.  Periods have been getting longer No vaginal discharge, no vaginal itching Drinking a lot of water.   Eating parsley to help with discomfort.  Meds:  None  No Known Allergies     Review of Systems     Objective:   Physical Exam   NAD HEENT:  PERRL, EOMI, bilateral discs sharp, TMs pearly gray, throat without injection Neck: supple, no adenopathy Chest:  CTA CV:  RRR without murmur or rub, radial pulses normal and equal. Back:  No CVA tenderness ABd:  S, NT, No HSM or mass.  +BS        Assessment & Plan:  1.  Dysuria:  UA with trace Leuks, +++ blood, but menstruating.  Send for culture.  No significant findings on exam, so will hold on antibiotics until culture received.  Push water.  2.  Prolonged Periods:  Return to evaluate in 2 weeks.  Pt. Worked in today for dysuria.

## 2015-12-13 NOTE — Patient Instructions (Signed)
[facility]

## 2015-12-15 LAB — URINE CULTURE

## 2015-12-21 ENCOUNTER — Ambulatory Visit: Payer: No Typology Code available for payment source

## 2015-12-21 ENCOUNTER — Other Ambulatory Visit: Payer: No Typology Code available for payment source

## 2016-01-04 ENCOUNTER — Ambulatory Visit: Payer: Self-pay

## 2016-01-04 ENCOUNTER — Other Ambulatory Visit: Payer: Self-pay

## 2016-01-04 VITALS — BP 132/94 | HR 78 | Temp 98.2°F | Resp 16 | Ht 63.0 in | Wt 197.0 lb

## 2016-01-04 DIAGNOSIS — Z0000 Encounter for general adult medical examination without abnormal findings: Secondary | ICD-10-CM

## 2016-01-04 NOTE — Progress Notes (Signed)
Patient is a new patient to the [facility] program and is currently a B[facility]CP patient effective 06/22/2015 with interpreter Anastasio Auerbach.   Clinical Measurements: Patient is 5 ft. 3 inches, weight 173.2 lbs, and BMI 35.   Medical History: Patient has no history of high cholesterol. Patient does not have a history of hypertension or diabetes. Per patient no diagnosed history of coronary heart disease, heart attack, heart failure, stroke/TIA, vascular disease or congenital heart defects.   Blood Pressure, Self-measurement: Patient states has no reason to check Blood pressure.  Nutrition Assessment: Patient stated that eats 2 to 3 fruits every day. Patient states she eats 2 to 3 servings of vegetables a day. Per patient states does eat 3 or more ounces of whole grains daily. Patient stated does eat two or more servings of fish weekly. Patient states she does not  drink more than 36 ounces or 450 calories of beverages with added sugars weekly.  Patient stated she does not watch her salt intake. Marland Kitchen  Physical Activity Assessment: Patient stated that does heavy cleanings for around 420 minutes of moderate exercise a week and rarely does any vigorous exercise.  Smoking Status: Patient has never smoked and is not exposed to smoke.   Quality of Life Assessment: In assessing patient's Physical quality of life she stated that out of the past 30 days that she has felt her health was bad all of them due to her bleeding problem. Patient also stated that in the past 30 days that her mental health was not good including stress, depression and problems with emotions for all days. Patient did state that out of the past 30 days she felt her physical or mental health had not kept her from doing her usual activities including self-care, work or recreation.   Plan: Lab work will be done today including a lipid panel, blood glucose, and Hgb A1C. Will call lab results when they are finished. Will discuss risk reduction Health  Coaching when call results.

## 2016-01-04 NOTE — Patient Instructions (Signed)
Discussed health assessment with patient. Informed patient that she would need to be followed up for blood pressure. She will be called with results of lab work and we will then discussed any further follow up the patient needs. Patient will implement behavior modifications that discussed to help lower BP. Patient verbalized understanding.

## 2016-01-05 ENCOUNTER — Telehealth: Payer: Self-pay

## 2016-01-05 LAB — LIPID PANEL
CHOL/HDL RATIO: 4.6 ratio — AB (ref 0.0–4.4)
Cholesterol, Total: 237 mg/dL — ABNORMAL HIGH (ref 100–199)
HDL: 51 mg/dL (ref >39)
LDL CALC: 160 mg/dL — AB (ref 0–99)
Triglycerides: 131 mg/dL (ref 0–149)
VLDL Cholesterol Cal: 26 mg/dL (ref 5–40)

## 2016-01-05 LAB — HEMOGLOBIN A1C
Est. average glucose Bld gHb Est-mCnc: 126 mg/dL
Hemoglobin A1c: 6 % — ABNORMAL HIGH (ref 4.8–5.6)

## 2016-01-05 NOTE — Telephone Encounter (Signed)
Patient was called on 3/31 per Anastasio Auerbach interpreter to inform about lab work from 01/04/16.  LAB RESULTS : Interpreter, Anastasio Auerbach informed patient: BMI 34.91, cholesterol- 237 , HDL- 51 , LDL- 160, triglycerides - 131,  and HBG-A1C - 6.0.    RISK REDUCTION  HEALTH COACHING:Did risk reduction counseling concerning BMI, Cholesterol, LDL, and A1C. Informed that normal BMI was 18 to 25 and patient is in overweight range. Discussed portion sizes and avoiding fatty food. Informed patient needs to stay away from drinks and food that have too much sugar and carbohydrates.To help with decreasing LDL's need to exercise and increase fiber in diet by eating raw vegetables. Patient wanted to try loose weight and improve numbers to begin with.   PLAN: Will call back to see if want to continue Health Coaching in person or by phone concerning nutrition and exercise to obtain a health weight. Will call back with appointment about blood pressure.

## 2016-01-09 ENCOUNTER — Ambulatory Visit: Payer: Self-pay | Admitting: Internal Medicine

## 2016-01-09 ENCOUNTER — Telehealth: Payer: Self-pay

## 2016-01-09 NOTE — Telephone Encounter (Signed)
Interpreter Anastasio Auerbach interpreter called patient to inform about doctor appointment concerning Blood Pressure. Told patient that she had an appointment  at [place] on April 7 at 10 AM. Explain to patient to not let practice take more lab work or do procedures unless an emergency until goes for eligibility for [place] Patient Assistance or [employer].

## 2016-01-10 NOTE — Progress Notes (Signed)
[facility]OMAN PATIENT NAVIGATION: Patient was referred to [place] concerning lab results of elevated Cholesterol, LDL's and blood pressure. Patient has appointment for April 7th.  NEEDS ASSESSMENT: Patient has barriers, help understanding medical follow up needs , not being aware of access to services and the needed support.  PLAN of CARE; Patient now has access to services but will need to be followed up on follow through of doing Eligibility. Will need to make sure what services are available to assist with doctor payment, medications and speciality care.   Patient will be approached for Health Coaching or other services to help with problem areas. Will discuss possible Heart Wise Program for blood pressure. Will explore possible other barriers patient may perceive.  Will need to see about follow equipment patient may need for blood pressure.  PLAN: Call patient for health coaching or other programs. Follow up per phone.

## 2016-01-12 ENCOUNTER — Ambulatory Visit: Payer: No Typology Code available for payment source | Admitting: Internal Medicine

## 2016-01-21 ENCOUNTER — Telehealth: Payer: Self-pay | Admitting: Internal Medicine

## 2016-01-25 NOTE — Telephone Encounter (Signed)
Contacted patient. Patient states she wants to stay here at [facility] and wants Dr. Amil Amen to continue as her PCP. Patient is going to call Sylvie Farrier to set up appointment to get the [facility]. Patient informed of Orange Card sign up every 3rd Thursday of the month. Patient to call us when OC is obtained

## 2016-04-02 ENCOUNTER — Telehealth: Payer: Self-pay

## 2016-04-02 NOTE — Telephone Encounter (Signed)
Called to do health coaching per Anastasio Auerbach inter[preterand was no answer. Left message on voice mail to return call.

## 2016-04-03 ENCOUNTER — Telehealth: Payer: Self-pay

## 2016-04-03 NOTE — Telephone Encounter (Signed)
Patient was called per Latoya [name] about health coaching after patient had returned call from yesterday. Patient never followed up with doctor appointment. Patient did talk with Dr. Amil Amen on the phone. Since patient's blood pressure was up on previous encounter I want to recheck and do some Health Coaching.  PLAN: Recheck BP. Health coach concerning abnormal lab values. Schedule appointment with Dr. Amil Amen at [facility] if needed.

## 2016-04-10 ENCOUNTER — Telehealth: Payer: Self-pay

## 2016-04-10 NOTE — Telephone Encounter (Signed)
Attempted to call about appointment but phone busy and tried two more times and still busy. Unable to leave message.

## 2016-04-11 ENCOUNTER — Ambulatory Visit: Payer: Self-pay

## 2016-04-11 VITALS — BP 144/96 | Ht 63.0 in | Wt 188.8 lb

## 2016-04-11 DIAGNOSIS — Z789 Other specified health status: Secondary | ICD-10-CM

## 2016-04-11 NOTE — Progress Notes (Signed)
[name] Patient returns today for Health Coaching. Interpreter Anastasio Auerbach present. Patient coming mainly because of prediabetic A1C, cholesterol, LDL and BMI 35 (Obese). I also, wanted to recheck patient's blood pressure.  BLOOD PRESSURE: Blood pressure was 148/96. Discussed non changeable and changeable risk factors with patient. Discussed changeable risk factors specific to patient. These included overweight and stress. Though when talked more with patient stated that cooks with bouillon cubes. Explained about the saltiness of bouillon cubes and needed to use herbs to season with or low salt -low fat broth.Discussed that would be helpful to loose 7 to 10 % of body weight, too.   HEALTH COACHING:  Patient and I went over lab results and looked at normal range. Patient picked from pictures what normally eats in a day. Patient is eating too many starches. Mainly went over decreasing sugars in diet and amount of starches eat per day. Patient received handouts in spanish with the first talking about portion sizes for each food group. We then looked at 1400 calorie diet and how many portions can have in each food group. Discussed getting [place] tuna in the can to eat or can salmon. Patient received handouts in Spanish and were reviewed on: A1C, BMI, Portions and serving sizes 1400 cal. Meal, Control Your Diabetes, food groups, Basic nutrition guidelines for people with diabetes, The Healthy Plate for Adults, What are portions, reading food labels, Fat, fiber, Activity Pyramid, and exercises in a notebook. Patient received bag with water bottle and snack container.   PLAN: Will Call in 2 to 3 weeks. Will call Dr. Amil Amen about patient's BP and labs.

## 2016-04-12 NOTE — Patient Instructions (Signed)
Will stop using bouillon cubes. Will start to read labels for amount salt and carbohydrates. Will decrease amount of starches that eat per day. Verbalized Understanding.

## 2016-04-25 ENCOUNTER — Telehealth: Payer: Self-pay

## 2016-04-25 NOTE — Telephone Encounter (Signed)
THIRD HEALTH COACH SESSION: Patient called per Anastasio Auerbach interpreter. When asked about what was doing to decrease cholesterol, LDL, A1C and BP patient stated that was using notebook, serving sizes and portions that we had gone over at the last health coaching session. Per patient has decreased the amount of sugar and starches she is eating. Patient stated is still waiting to get orange card renewal  for can see Dr. Amil Amen. Patient stated that hopes everything is better before sees the doctor. Asked how was doing trying to lowe blood pressure. Patient stated that gotten rid of bouillon cubes and was watching her salt in take.  PLAN: Call in 3 to for weeks for follow up assessment. TIME: 15 minutes

## 2016-05-01 ENCOUNTER — Telehealth: Payer: Self-pay

## 2016-05-01 NOTE — Telephone Encounter (Signed)
Attempted to call patient by Anastasio Auerbach interpreter. No answer and no message left.

## 2016-05-08 ENCOUNTER — Telehealth: Payer: Self-pay

## 2016-05-08 NOTE — Telephone Encounter (Signed)
Patient's daughter had called Anastasio Auerbach interpreter and left message. Interpreter called back and then patient called back Interpreter informed patient that she would call her back on 05/09/16 to complete her [facility]OM[facility] follow up LSP/HC. Patient stated that would be all right.

## 2016-05-09 ENCOUNTER — Telehealth: Payer: Self-pay

## 2016-05-09 NOTE — Telephone Encounter (Signed)
Patient called per Anastasio Auerbach interpreter for final Follow up LSP/HC.  ASSESSMENT :This is a follow up Assessment following [place] . Marland Kitchen  Medication Status : Patient states is not taking medication for high cholesterol, hypertension, or diabetes.   Blood Pressure, Self-measurement: Patient states has no reason to check Blood pressure.  Nutrition Assessment: Patient stated that eats 3 fruits every day. Patient states she eats 5 servings of vegetables a day. Per Patient eats 3 or more ounces of whole grains daily. Patient stated that eats two or more servings of fish weekly.  Patient states she does not drink more than 36 ounces or 450 calories of beverages with added sugars weekly. Patient stated she does watch her salt intake.   Physical Activity Assessment: Patient stated she does around 210 minutes of moderate and 210 minutes of vigorous activity per week.  Smoking Status: Patient stated has never smoked and is not exposed to smoke.  Quality of Life Assessment: In assessing patient's Physical quality of life she stated that out of the past 30 days that she has felt her health is good all of them. Patient also stated that in the past 30 days that her mental health was good including stress, depression and problems with emotions for all days. Patient did state that out of the past 30 days she felt her physical or mental health had not kept her from doing her usual activities including self-care, work or recreation.   PLAN: Informed patient that would re screen if does not have insurance when and if she returns to [facility] next year.  TIME SPENT with PATIENT: 15 minutes

## 2016-05-10 ENCOUNTER — Telehealth: Payer: Self-pay

## 2016-05-10 NOTE — Telephone Encounter (Signed)
Called to inform Dr. Amil Amen of patient's high blood pressure. Patient waiting to get orange card renewed. Voicemail full. Will attempt again.

## 2016-05-29 ENCOUNTER — Encounter: Payer: Self-pay | Admitting: Internal Medicine

## 2016-05-29 ENCOUNTER — Ambulatory Visit (INDEPENDENT_AMBULATORY_CARE_PROVIDER_SITE_OTHER): Payer: Self-pay | Admitting: Internal Medicine

## 2016-05-29 VITALS — BP 132/90 | HR 82 | Resp 20 | Ht 62.25 in | Wt 187.0 lb

## 2016-05-29 DIAGNOSIS — I10 Essential (primary) hypertension: Secondary | ICD-10-CM

## 2016-05-29 DIAGNOSIS — N951 Menopausal and female climacteric states: Secondary | ICD-10-CM

## 2016-05-29 DIAGNOSIS — E669 Obesity, unspecified: Secondary | ICD-10-CM

## 2016-05-29 DIAGNOSIS — E1165 Type 2 diabetes mellitus with hyperglycemia: Secondary | ICD-10-CM | POA: Insufficient documentation

## 2016-05-29 DIAGNOSIS — E1169 Type 2 diabetes mellitus with other specified complication: Secondary | ICD-10-CM | POA: Insufficient documentation

## 2016-05-29 DIAGNOSIS — IMO0001 Reserved for inherently not codable concepts without codable children: Secondary | ICD-10-CM | POA: Insufficient documentation

## 2016-05-29 DIAGNOSIS — E785 Hyperlipidemia, unspecified: Secondary | ICD-10-CM

## 2016-05-29 DIAGNOSIS — R7303 Prediabetes: Secondary | ICD-10-CM

## 2016-05-29 DIAGNOSIS — E119 Type 2 diabetes mellitus without complications: Secondary | ICD-10-CM

## 2016-05-29 DIAGNOSIS — E782 Mixed hyperlipidemia: Secondary | ICD-10-CM

## 2016-05-29 DIAGNOSIS — R030 Elevated blood-pressure reading, without diagnosis of hypertension: Secondary | ICD-10-CM

## 2016-05-29 HISTORY — DX: Type 2 diabetes mellitus without complications: E11.9

## 2016-05-29 NOTE — Patient Instructions (Addendum)
Tome un vaso de agua antes de cada comida Tome un minimo de 6 a 8 vasos de agua diarios Coma tres veces al dia Coma una proteina y Ardelia Mems grasa saludable con comida.  (huevos, pescado, pollo, pavo, y limite carnes rojas Coma 5 porciones diarias de legumbres.  Mezcle los colores Coma 2 porciones diarias de frutas con cascara cuando sea comestible Use platos pequeos Suelte su tenedor o cuchara despues de cada mordida hata que se mastique y se trague Come en la mesa con amigos o familiares por lo menos una vez al dia Apague la televisin y aparatos electrnicos durante la comida  Su objetivo debe ser perder Ardelia Mems libra por semana  Black cohosh

## 2016-05-29 NOTE — Progress Notes (Signed)
   Subjective:    Patient ID: Latoya [name], female    DOB: 06-08-1971, 45 y.o.   MRN: [id number]  HPI   Concerned with menopausal symptoms:  Skipping periods.  Did not have period for 2 months, then 15 days of heavy flow in July.  Started July 14th.  Has not had a period yet this month.   Gets hot at times.  Headaches close to period time.   Sometimes with sweats at night.   Skin is drier now.   Eyes tired at times.  Uses reading glasses--prescription strength.  Review of chart with [facility] [place] program, note she is generally running a high bp, had an A1C of 6.0% and cholesterol of 237 with HDL 51, LDL of 160, Triglycerides of 131 in March.. I do not see repeat labs since. Has lost 10 lbs since end of March  Pt. States she is eating better--doing a lot of smoothie stuff with vegetables and fruit.  Current Meds  Medication Sig  . Multiple Vitamins-Minerals (MULTIVITAMIN WITH MINERALS) tablet Take 1 tablet by mouth daily. Reported on 12/13/2015   No Known Allergies    Review of Systems     Objective:   Physical Exam   NAD HEENT:  PERRL, EOMI, TMs pearly gray, throat without injection Neck:  Supple, no adenopathy, no thyromegaly Chest:  CTA CV:  RRR without murmur or rub, radial and DP pulses normal and equal Abd:  S, NT, No HSM or mass, + BS LE:  No edema        Assessment & Plan:  1.  Perimenopausal:  Discussed options for treatment:  Do nothing, consider more soy in diet or something such as Estroven, which she has already tried without much help.   Discussed would not recommend ERT with her other risks for heart disease. May try Black Cohosh To try and stay regularly physically active, regular sleep/wake routine.  2.  Prediabetes:  Discussed continue work with lifestyle changes:  Diet and physical activity.  Recheck A1C  3.  Hyperlipidemia:  As in #2.  reheck FLP:  Patient not fasting today, so will return in next 2 weeks for fasting labs, including above and  TSH, cmp  4.  Elevated BP:  Still a bit high with diastolic today, but has gradually decreased with weight loss.  Follow for now, CMP  followup with me in 2 months

## 2016-06-14 ENCOUNTER — Ambulatory Visit (INDEPENDENT_AMBULATORY_CARE_PROVIDER_SITE_OTHER): Payer: Self-pay

## 2016-06-14 VITALS — BP 142/92 | HR 80

## 2016-06-14 DIAGNOSIS — Z23 Encounter for immunization: Secondary | ICD-10-CM

## 2016-06-14 DIAGNOSIS — R030 Elevated blood-pressure reading, without diagnosis of hypertension: Secondary | ICD-10-CM

## 2016-06-14 DIAGNOSIS — R7303 Prediabetes: Secondary | ICD-10-CM

## 2016-06-14 DIAGNOSIS — E785 Hyperlipidemia, unspecified: Secondary | ICD-10-CM

## 2016-06-14 DIAGNOSIS — IMO0001 Reserved for inherently not codable concepts without codable children: Secondary | ICD-10-CM

## 2016-06-15 LAB — LIPID PANEL W/O CHOL/HDL RATIO
Cholesterol, Total: 190 mg/dL (ref 100–199)
HDL: 57 mg/dL (ref >39)
LDL Calculated: 116 mg/dL — ABNORMAL HIGH (ref 0–99)
Triglycerides: 83 mg/dL (ref 0–149)
VLDL Cholesterol Cal: 17 mg/dL (ref 5–40)

## 2016-06-15 LAB — COMPREHENSIVE METABOLIC PANEL
ALBUMIN: 4.5 g/dL (ref 3.5–5.5)
ALK PHOS: 111 IU/L (ref 39–117)
ALT: 36 IU/L — ABNORMAL HIGH (ref 0–32)
AST: 29 IU/L (ref 0–40)
Albumin/Globulin Ratio: 1.6 (ref 1.2–2.2)
BUN / CREAT RATIO: 13 (ref 9–23)
BUN: 9 mg/dL (ref 6–24)
Bilirubin Total: 0.4 mg/dL (ref 0.0–1.2)
CALCIUM: 9.3 mg/dL (ref 8.7–10.2)
CO2: 24 mmol/L (ref 18–29)
CREATININE: 0.7 mg/dL (ref 0.57–1.00)
Chloride: 100 mmol/L (ref 96–106)
GFR, EST AFRICAN AMERICAN: 119 mL/min/{1.73_m2} (ref >59)
GFR, EST NON AFRICAN AMERICAN: 104 mL/min/{1.73_m2} (ref >59)
GLOBULIN, TOTAL: 2.9 g/dL (ref 1.5–4.5)
Glucose: 107 mg/dL — ABNORMAL HIGH (ref 65–99)
Potassium: 3.8 mmol/L (ref 3.5–5.2)
SODIUM: 142 mmol/L (ref 134–144)
TOTAL PROTEIN: 7.4 g/dL (ref 6.0–8.5)

## 2016-06-15 LAB — HGB A1C W/O EAG: Hgb A1c MFr Bld: 6 % — ABNORMAL HIGH (ref 4.8–5.6)

## 2016-06-15 LAB — TSH: TSH: 0.603 u[IU]/mL (ref 0.450–4.500)

## 2016-08-08 ENCOUNTER — Ambulatory Visit: Payer: Self-pay | Admitting: Internal Medicine

## 2016-09-05 ENCOUNTER — Other Ambulatory Visit: Payer: Self-pay | Admitting: Internal Medicine

## 2016-09-05 DIAGNOSIS — Z1231 Encounter for screening mammogram for malignant neoplasm of breast: Secondary | ICD-10-CM

## 2016-09-19 ENCOUNTER — Other Ambulatory Visit: Payer: Self-pay

## 2016-09-20 ENCOUNTER — Other Ambulatory Visit (INDEPENDENT_AMBULATORY_CARE_PROVIDER_SITE_OTHER): Payer: Self-pay

## 2016-09-20 DIAGNOSIS — R7303 Prediabetes: Secondary | ICD-10-CM

## 2016-09-21 LAB — HGB A1C W/O EAG: Hgb A1c MFr Bld: 5.6 % (ref 4.8–5.6)

## 2016-09-26 ENCOUNTER — Ambulatory Visit: Payer: Self-pay | Admitting: Internal Medicine

## 2016-10-03 ENCOUNTER — Encounter: Payer: Self-pay | Admitting: Internal Medicine

## 2016-10-03 ENCOUNTER — Ambulatory Visit (INDEPENDENT_AMBULATORY_CARE_PROVIDER_SITE_OTHER): Payer: Self-pay | Admitting: Internal Medicine

## 2016-10-03 VITALS — BP 122/80 | HR 78 | Resp 12 | Ht 62.5 in | Wt 179.0 lb

## 2016-10-03 DIAGNOSIS — N951 Menopausal and female climacteric states: Secondary | ICD-10-CM

## 2016-10-03 DIAGNOSIS — R030 Elevated blood-pressure reading, without diagnosis of hypertension: Secondary | ICD-10-CM

## 2016-10-03 DIAGNOSIS — R7303 Prediabetes: Secondary | ICD-10-CM

## 2016-10-03 LAB — GLUCOSE, POCT (MANUAL RESULT ENTRY): POC Glucose: 85 mg/dl (ref 70–99)

## 2016-10-03 NOTE — Patient Instructions (Addendum)
Vagisil silk a necesita o Astroglide a nesita  May take Estroven tablets or eat more soy (tofu or soy milk)

## 2016-10-03 NOTE — Progress Notes (Signed)
   Subjective:    Patient ID: Latoya [name], female    DOB: 06/19/1971, 45 y.o.   MRN: [id number]  HPI   1.  Prediabetes: A1C now down to 5.6% from 6.0%--in normal range.  2.  Obesity:  Encourage by weight loss of 8 lbs from August.  3.  Elevated BP:  Discussed bp at a good level today.    4.  Hyperlipidemia:  Cholesterol in September was acceptable, though would like to see her LDL lower than 116.  5.  Patient having vaginal dryness with intercourse and symptomatic outside of intercourse  Current Meds  Medication Sig  . Multiple Vitamins-Minerals (MULTIVITAMIN WITH MINERALS) tablet Take 1 tablet by mouth daily. Reported on 12/13/2015   No Known Allergies    Review of Systems     Objective:   Physical Exam   NAD Lungs:  CTA CV:  RRR without murmur or rub, radial pulses normal and equal Abd;  S, NT, No HSM or mass, + BS        Assessment & Plan:  1.  Obesity:  Will come in for nurse visit for weigh ins monthly to stay on track with goals.  2.  Prediabetes:  A1C now down into normal range with lifestyle changes.  3.  Hyperlipidemia:  Ok with last check--follow  4.  Perimenopause:  With vaginal dryness.  Discussed topicals to help with vaginal dryness:  Vagisil silk or astroglide.  5.  Elevated BP:  At a good level today.

## 2016-10-11 ENCOUNTER — Ambulatory Visit: Payer: Self-pay

## 2016-11-21 ENCOUNTER — Other Ambulatory Visit: Payer: Self-pay | Admitting: Obstetrics and Gynecology

## 2016-11-21 DIAGNOSIS — Z1231 Encounter for screening mammogram for malignant neoplasm of breast: Secondary | ICD-10-CM

## 2016-12-05 ENCOUNTER — Ambulatory Visit (HOSPITAL_COMMUNITY)
Admission: RE | Admit: 2016-12-05 | Discharge: 2016-12-05 | Disposition: A | Discharge status: Home / Self Care | Payer: Self-pay | Source: Ambulatory Visit | Attending: Obstetrics and Gynecology | Admitting: Obstetrics and Gynecology

## 2016-12-05 ENCOUNTER — Ambulatory Visit
Admission: RE | Admit: 2016-12-05 | Discharge: 2016-12-05 | Disposition: A | Discharge status: Home / Self Care | Payer: No Typology Code available for payment source | Source: Ambulatory Visit | Attending: Obstetrics and Gynecology | Admitting: Obstetrics and Gynecology

## 2016-12-05 ENCOUNTER — Encounter (HOSPITAL_COMMUNITY): Payer: Self-pay | Admitting: *Deleted

## 2016-12-05 VITALS — BP 118/72 | Temp 98.2°F | Ht 63.0 in | Wt 185.2 lb

## 2016-12-05 DIAGNOSIS — Z1231 Encounter for screening mammogram for malignant neoplasm of breast: Secondary | ICD-10-CM

## 2016-12-05 DIAGNOSIS — Z01419 Encounter for gynecological examination (general) (routine) without abnormal findings: Secondary | ICD-10-CM

## 2016-12-05 NOTE — Patient Instructions (Addendum)
Explained breast self awareness with [name]. Let patient know that if today's Pap smear is normal then her next Pap smear is due in three years due to her history. Referred patient to the [facility] for a screening mammogram. Appointment scheduled for Thursday, December 05, 2016 at 1510. Let patient know will follow up with her within the next couple weeks with results of Pap smear by phone. Informed patient that the Breast Center will follow up with her within the next couple of weeks with results of mammogram by letter or phone. [name] verbalized understanding.  [place], Arvil Chaco, RN 2:56 PM

## 2016-12-05 NOTE — Addendum Note (Signed)
Encounter addended by: Loletta Parish, RN on: 12/05/2016  3:31 PM<BR>    Actions taken: Sign clinical note

## 2016-12-05 NOTE — Progress Notes (Signed)
No complaints today.   Pap Smear: Pap smear completed today. Last Pap smear was 06/22/2015 in [facility] and normal with negative HPV. Patient has a history of an abnormal Pap smear 06/24/2012 that was LSIL with a few cells suggestive of a higher grade lesion. 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 Patient has had two normal Pap smears since LEEP 06/22/2015 and 09/25/2013. Last three Pap smear, colposcopy, and LEEP reports are in EPIC.  Physical exam: Breasts Breasts symmetrical. No skin abnormalities bilateral breasts. No nipple retraction bilateral breasts. No nipple discharge bilateral breasts. No lymphadenopathy. No lumps palpated bilateral breasts. No complaints of pain or tenderness on exam. Referred patient to the [facility] for a screening mammogram. Appointment scheduled for Thursday, December 05, 2016 at 1510.  Pelvic/Bimanual   Ext Genitalia No lesions, no swelling and no discharge observed on external genitalia.         Vagina Vagina pink and normal texture. No lesions or discharge observed in vagina.          Cervix Cervix is present. Cervix pink and of normal texture. No discharge observed.     Uterus Uterus is present and palpable. Uterus in normal position and normal size.        Adnexae Bilateral ovaries present and palpable. No tenderness on palpation.          Rectovaginal No rectal exam completed today since patient had no rectal complaints. No skin abnormalities observed on exam.    Smoking History: Patient has never smoked.  Patient Navigation: Patient education provided. Access to services provided for patient through [facility] program. Spanish interpreter provided.  Used Spanish interpreter [employer] from [facility].

## 2016-12-06 ENCOUNTER — Encounter (HOSPITAL_COMMUNITY): Payer: Self-pay | Admitting: *Deleted

## 2016-12-06 LAB — CYTOLOGY - PAP
DIAGNOSIS: NEGATIVE
HPV (WINDOPATH): NOT DETECTED

## 2016-12-11 ENCOUNTER — Telehealth (HOSPITAL_COMMUNITY): Payer: Self-pay | Admitting: *Deleted

## 2016-12-11 NOTE — Telephone Encounter (Signed)
Telephoned patient at home number and left message. Used interpreter Lavon Paganini.

## 2016-12-17 ENCOUNTER — Telehealth (HOSPITAL_COMMUNITY): Payer: Self-pay | Admitting: *Deleted

## 2016-12-17 NOTE — Telephone Encounter (Signed)
Telephoned patient at home number and left message to return call to [facility]. Used interpreter Lavon Paganini.

## 2017-01-06 ENCOUNTER — Encounter (HOSPITAL_COMMUNITY): Payer: Self-pay | Admitting: *Deleted

## 2017-01-06 NOTE — Progress Notes (Signed)
Letter mailed to patient concerning pap smear results.

## 2017-07-14 ENCOUNTER — Encounter (HOSPITAL_COMMUNITY): Payer: Self-pay

## 2017-09-19 ENCOUNTER — Encounter (HOSPITAL_COMMUNITY): Payer: Self-pay

## 2018-02-04 ENCOUNTER — Ambulatory Visit (INDEPENDENT_AMBULATORY_CARE_PROVIDER_SITE_OTHER): Payer: BLUE CROSS/BLUE SHIELD | Admitting: Family Medicine

## 2018-02-04 ENCOUNTER — Encounter: Payer: Self-pay | Admitting: Family Medicine

## 2018-02-04 VITALS — BP 118/80 | HR 80 | Temp 98.0°F | Resp 16 | Ht 62.5 in | Wt 186.0 lb

## 2018-02-04 DIAGNOSIS — E669 Obesity, unspecified: Secondary | ICD-10-CM

## 2018-02-04 DIAGNOSIS — E66811 Obesity, class 1: Secondary | ICD-10-CM

## 2018-02-04 DIAGNOSIS — Z0000 Encounter for general adult medical examination without abnormal findings: Secondary | ICD-10-CM

## 2018-02-04 DIAGNOSIS — N951 Menopausal and female climacteric states: Secondary | ICD-10-CM

## 2018-02-04 DIAGNOSIS — Z7689 Persons encountering health services in other specified circumstances: Secondary | ICD-10-CM | POA: Diagnosis not present

## 2018-02-04 HISTORY — DX: Obesity, class 1: E66.811

## 2018-02-04 HISTORY — DX: Obesity, unspecified: E66.9

## 2018-02-04 LAB — LIPID PANEL
CHOLESTEROL: 180 mg/dL (ref <200)
HDL: 56 mg/dL (ref >50)
LDL CHOLESTEROL (CALC): 103 mg/dL — AB
Non-HDL Cholesterol (Calc): 124 mg/dL (calc) (ref <130)
Total CHOL/HDL Ratio: 3.2 (calc) (ref <5.0)
Triglycerides: 118 mg/dL (ref <150)

## 2018-02-04 LAB — CBC WITH DIFFERENTIAL/PLATELET
BASOS PCT: 1 %
Basophils Absolute: 73 cells/uL (ref 0–200)
EOS PCT: 1.8 %
Eosinophils Absolute: 131 cells/uL (ref 15–500)
HCT: 41.3 % (ref 35.0–45.0)
Hemoglobin: 13.8 g/dL (ref 11.7–15.5)
Lymphs Abs: 2847 cells/uL (ref 850–3900)
MCH: 30.7 pg (ref 27.0–33.0)
MCHC: 33.4 g/dL (ref 32.0–36.0)
MCV: 92 fL (ref 80.0–100.0)
MONOS PCT: 6.5 %
MPV: 10.8 fL (ref 7.5–12.5)
NEUTROS ABS: 3774 {cells}/uL (ref 1500–7800)
Neutrophils Relative %: 51.7 %
Platelets: 346 10*3/uL (ref 140–400)
RBC: 4.49 10*6/uL (ref 3.80–5.10)
RDW: 12.1 % (ref 11.0–15.0)
Total Lymphocyte: 39 %
WBC mixed population: 475 cells/uL (ref 200–950)
WBC: 7.3 10*3/uL (ref 3.8–10.8)

## 2018-02-04 LAB — COMPLETE METABOLIC PANEL WITH GFR
AG Ratio: 1.5 (calc) (ref 1.0–2.5)
ALT: 74 U/L — AB (ref 6–29)
AST: 43 U/L — AB (ref 10–35)
Albumin: 4.3 g/dL (ref 3.6–5.1)
Alkaline phosphatase (APISO): 122 U/L — ABNORMAL HIGH (ref 33–115)
BILIRUBIN TOTAL: 0.5 mg/dL (ref 0.2–1.2)
BUN: 8 mg/dL (ref 7–25)
CALCIUM: 9.4 mg/dL (ref 8.6–10.2)
CHLORIDE: 103 mmol/L (ref 98–110)
CO2: 27 mmol/L (ref 20–32)
Creat: 0.66 mg/dL (ref 0.50–1.10)
GFR, EST NON AFRICAN AMERICAN: 104 mL/min/{1.73_m2} (ref >=60)
GFR, Est African American: 121 mL/min/{1.73_m2} (ref >=60)
Globulin: 2.8 g/dL (calc) (ref 1.9–3.7)
Glucose, Bld: 93 mg/dL (ref 65–99)
POTASSIUM: 4.3 mmol/L (ref 3.5–5.3)
Sodium: 138 mmol/L (ref 135–146)
Total Protein: 7.1 g/dL (ref 6.1–8.1)

## 2018-02-04 LAB — TSH: TSH: 0.8 mIU/L

## 2018-02-04 MED ORDER — MULTI-VITAMIN/MINERALS PO TABS: 1.0000 | ORAL_TABLET | Freq: Every day | ORAL | 3 refills | Status: DC

## 2018-02-04 NOTE — Progress Notes (Signed)
Patient ID: Latoya [name] MRN: [id number], DOB: September 08, 1971, 47 y.o. Date of Encounter: 02/04/18    Chief Complaint:  Chief Complaint  Patient presents with  . New Patient (Initial Visit)  . Annual Exam    SUBJECTIVE:   HPI: Latoya [name] Latoya [name] is a 47 y.o. year old female  presents to become established as a new patient also for CPE. History, physical and plan is all done with the help of in person Spanish translator.  Patient was previously established with internal medicine Dr. Amil Amen, last visit in epic was December 2017.  Last year she also had some well woman exams done at [facility] in [place].  Her past medical history significant for borderline diabetes which improved with diet and exercise, last documented A1C per chart review was 5.6 down from 6.0, dyslipidemia, also treated with diet and exercise, obesity, and perimenopausal symptoms that began 2 to 3 years ago.  Social and Family history is reviewed and updated as documented below. Patient drinks alcohol very occasionally, no history of smoking, is G5 P5-0-0-5, history of C-section and tubal ligation, no other surgical history.  And 2013 patient had a Pap smear with LSIL with cells suggestive of higher grade lesion, colposcopy and LEEP procedure were done and she had 2 normal Paps since that time in 2014, 2016, with negative HPV.  Most recent and chart is March 2018 also negative cytology and negative HPV.  Patient's last menstrual period was 12/23/17, after not having a period for 6 months.  She states that she continues to have intermittent brief hot flashes and sweats which are not very bothersome to her, mild, occassional.  Continues to have unchanged vaginal dryness and uses lubricant as needed.  No urinary or vaginal sx otherwise. Last mammogram was done 12/05/2016 and she is due for them annually but she needed a new provider to order this.  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  Her only other symptoms that she brings up is occasional dependent edema which resolves with elevating legs overnight and occasionally accompanied by brief tingling and itching in her lower extremities.    She is concerned with weight gain since she was seen last by a physician, states that she can gain weight very quickly but it is very difficult to lose weight.  She is been working a lot for the last several months and for the last month has been so tired from work that she has not gone to work out as much as she normally does, she only restarted in the last 3 days.  She tries to eat well and restrict excess calories.    She has no other acute complaints, symptoms or concerns.  Past Medical History:  Diagnosis Date  . Depression    Pt states situational with health scares with abnormal Paps, procedures, also breast lump with mammograms and ultrasounds, concerns and depression resolved with the medical problems and she was never medically treated  . History of abnormal cervical Pap smear 2016     Home Meds: Outpatient Medications Prior to Visit  Medication Sig Dispense Refill  . Multiple Vitamins-Minerals (MULTIVITAMIN WITH MINERALS) tablet Take 1 tablet by mouth daily. Reported on 12/13/2015     No facility-administered medications prior to visit.     Allergies:  No Known Allergies  Social History   Socioeconomic History  .  Marital status: Married    Spouse name: Not on file  . Number of children: Not on file  . Years of education: Not on file  . Highest education level: Not on file  Occupational History  . Not on file  Social Needs  . Financial resource strain: Not on file  . Food insecurity:    Worry: Not on file    Inability: Not on file  . Transportation needs:    Medical: Not on file    Non-medical: Not on file  Tobacco Use  . Smoking status: Never Smoker  . Smokeless tobacco: Never  Used  Substance and Sexual Activity  . Alcohol use: Yes    Comment: rarely  . Drug use: No  . Sexual activity: Yes    Partners: Male    Birth control/protection: Surgical    Comment: Monogamous with husband  Lifestyle  . Physical activity:    Days per week: 3 days    Minutes per session: 60 min  . Stress: Not at all  Relationships  . Social connections:    Talks on phone: Not on file    Gets together: Not on file    Attends religious service: Not on file    Active member of club or organization: Not on file    Attends meetings of clubs or organizations: Not on file    Relationship status: Not on file  . Intimate partner violence:    Fear of current or ex partner: Not on file    Emotionally abused: Not on file    Physically abused: Not on file    Forced sexual activity: Not on file  Other Topics Concern  . Not on file  Social History Narrative  . Not on file    Family History  Problem Relation Age of Onset  . Cancer Mother        uterine  . Diabetes Mother   . Hypertension Mother   . Diabetes Father   . Hypertension Father   . Cancer Father        stomach cancer ?  Marland Kitchen Heart disease Father   . Diabetes Sister        Office Visit from 02/04/2018 in [place]  PHQ-9 Total Score  0      Review of Systems  Review of Systems  Constitutional: Negative.  Negative for chills, diaphoresis, fever, malaise/fatigue and weight loss.  HENT: Negative.  Negative for congestion, hearing loss, sinus pain, sore throat and tinnitus.   Eyes: Negative.  Negative for blurred vision, double vision and photophobia.  Respiratory: Negative.  Negative for cough, shortness of breath and wheezing.   Cardiovascular: Negative.  Negative for chest pain, palpitations, orthopnea, claudication, leg swelling and PND.  Gastrointestinal: Negative.  Negative for abdominal pain, blood in stool, constipation, heartburn and melena.  Genitourinary: Negative.  Negative for dysuria, flank  pain, frequency, hematuria and urgency.  Musculoskeletal: Negative.  Negative for back pain, falls, joint pain, myalgias and neck pain.  Skin: Negative.  Negative for itching and rash.  Neurological: Negative.  Negative for dizziness, tremors, seizures, loss of consciousness, weakness and headaches.  Endo/Heme/Allergies: Negative.  Negative for environmental allergies.  Psychiatric/Behavioral: Negative.  Negative for depression, substance abuse and suicidal ideas. The patient is not nervous/anxious and does not have insomnia.   All other systems reviewed and are negative.   OBJECTIVE:   Vitals:   02/04/18 0835  BP: 118/80  Pulse: 80  Resp: 16  Temp: 98  F (36.7 C)  TempSrc: Oral  SpO2: 98%  Weight: 186 lb (84.4 kg)  Height: 5' 2.5" (1.588 m)    Physical Exam  Constitutional: She is oriented to person, place, and time. She appears well-developed and well-nourished.  Non-toxic appearance. No distress.  HENT:  Head: Normocephalic and atraumatic.  Right Ear: External ear normal.  Left Ear: External ear normal.  Nose: Nose normal.  Mouth/Throat: Uvula is midline, oropharynx is clear and moist and mucous membranes are normal. No oropharyngeal exudate.  Eyes: Pupils are equal, round, and reactive to light. Conjunctivae, EOM and lids are normal. Right eye exhibits no discharge. Left eye exhibits no discharge. No scleral icterus.  Neck: Normal range of motion and phonation normal. Neck supple. No JVD present. No tracheal deviation present. No thyromegaly present.  Cardiovascular: Normal rate, regular rhythm, normal heart sounds, intact distal pulses and normal pulses. Exam reveals no gallop and no friction rub.  No murmur heard. Pulses:      Radial pulses are 2+ on the right side, and 2+ on the left side.       Posterior tibial pulses are 2+ on the right side, and 2+ on the left side.  No lower extremity edema  Pulmonary/Chest: Effort normal and breath sounds normal. No stridor. No  respiratory distress. She has no wheezes. She has no rhonchi. She has no rales. She exhibits no tenderness.  Abdominal: Soft. Normal appearance and bowel sounds are normal. She exhibits no distension and no mass. There is no tenderness. There is no rebound and no guarding.  No CVA tenderness  Musculoskeletal: Normal range of motion. She exhibits no edema or deformity.  Lymphadenopathy:    She has no cervical adenopathy.  Neurological: She is alert and oriented to person, place, and time. No cranial nerve deficit or sensory deficit. She exhibits normal muscle tone. Coordination and gait normal.  Skin: Skin is warm, dry and intact. Capillary refill takes less than 2 seconds. No rash noted. She is not diaphoretic. No pallor.  Psychiatric: She has a normal mood and affect. Her speech is normal and behavior is normal. Judgment and thought content normal.  Nursing note and vitals reviewed.      ASSESSMENT AND PLAN:  47 y.o. year old female here to establish care and to do CPE Discussed weight, diet and exercise.    A. Screening Labs: CMP, FLP, CBC, TSH, A1C, HIV  B. Pap: uptodate 12/05/16 Next due 12/06/19  C. Screening Mammogram: Due - ordered Printed instructions, address and phone number given to the patient  D. DEXA/BMD:  Not currently indicated per age  E. Colorectal Cancer Screening: Not currently indicated per age, at age 62  F. Immunizations:   Influenza:  Due 05/07/18 Tetanus:  Up to date Pneumococcal:  N/A Zostavax:  N/A   Problem List Items Addressed This Visit      Other   Obesity (BMI 30.0-34.9)   Relevant Orders   COMPLETE METABOLIC PANEL WITH GFR   CBC with Differential   Lipid Panel   TSH   Hemoglobin A1c    Other Visit Diagnoses    Annual visit for general adult medical examination without abnormal findings    -  Primary   Relevant Medications   Multiple Vitamins-Minerals (MULTIVITAMIN WITH MINERALS) tablet   Other Relevant Orders   COMPLETE METABOLIC  PANEL WITH GFR   CBC with Differential   Lipid Panel   MS DIGITAL SCREENING BILATERAL   HIV antibody   TSH  Hemoglobin A1c   Perimenopausal       Encounter to establish care             ICD-10-CM   1. Annual visit for general adult medical examination without abnormal findings U36.72 COMPLETE METABOLIC PANEL WITH GFR    CBC with Differential    Lipid Panel    MS DIGITAL SCREENING BILATERAL    Multiple Vitamins-Minerals (MULTIVITAMIN WITH MINERALS) tablet    HIV antibody    TSH    Hemoglobin A1c  2. Obesity (BMI 30.0-34.9) V50.0 COMPLETE METABOLIC PANEL WITH GFR    CBC with Differential    Lipid Panel    TSH    Hemoglobin A1c   Discussed diet and exercise, negative net calories to reduce weight.   screening labs ordered,   3. Perimenopausal N95.1   4. Encounter to establish care Z76.89      Pending labs, if abnormalities need to be addressed will set a follow-up appointment at that time depending on needed treatment or follow-up.  Currently patient is advised to return in 2 to 3 months for weight recheck and to address BMI, diet exercise and weight loss goals.    Signed,  Delsa Grana MPAS, PA-C  02/04/2018 2:11 PM

## 2018-02-04 NOTE — Patient Instructions (Addendum)
Multivitamin to your pharmacy  Keep up good work with diet and exercise  Mammogram  [facility] [place] [facility]  Hours of Operation In order to serve more women in [place] and the surrounding areas, [facility] has extended operating hours. Monday 7am-6pm Tuesday-Friday 7am-5pm Directions [place], [place] [address]  Can call if you do not get a call  Schedule an appointment by calling [phone].   Mamografa Mammogram [employer] es un radiografa de las mamas que se realiza para determinar si hay cambios anormales. Este procedimiento permite explorar y Actuary cambio que pudiera sugerir la presencia de cncer de mama. La mamografa tambin puede identificar otros cambios y variaciones en las [place], por ejemplo:  La inflamacin del tejido mamario (mastitis).  Una zona infectada que contiene una acumulacin de pus (absceso).  Una cavidad llena de lquido (quiste).  Cambios fibroqusticos. Estos ocurren cuando el tejido [employer] se vuelve ms denso, lo que, a la palpacin, puede hacer que se perciba como una cuerda o una superficie irregular debajo de la piel.  Tumores no cancerosos (benignos).  Informe al mdico acerca de lo siguiente:  Cualquier alergia que tenga.  Si tiene implantes mamarios.  Si ha tenido enfermedades, biopsias o cirugas previas de Scientist, research (medical).  Si est amamantando.  Cualquier posibilidad de que estuviera embarazada, si corresponde.  Si es menor de 25aos.  Si tiene antecedentes familiares de cncer de mama. Cules son los riesgos? En general, se trata de un procedimiento seguro. Sin embargo, pueden ocurrir complicaciones, por ejemplo:  Exposicin a la radiacin. En [employer], los [place] de radiacin son muy bajos.  La interpretacin [employer] Corporation.  La necesidad de Optometrist ms [employer] Communications.  La imposibilidad de la mamografa de  Hydrographic surveyor algunos tipos de cncer.  Qu ocurre antes del procedimiento?  Programe el estudio para hacrselo aproximadamente 1 o 2semanas despus de la [place]. Generalmente, este es el momento en que las mamas estn menos sensibles.  Si se hizo Musician en un centro diferente en el pasado, trate de conseguirla o solicite que la enven al nuevo centro de estudios con el fin de compararlas.  El da del estudio, lvese las [place] y [place].  No use desodorantes, perfumes, lociones o talcos en ningn lugar del cuerpo el da del [place].  Qutese las alhajas del cuello.  Use prendas que pueda ponerse y sacarse fcilmente. Qu ocurre durante el procedimiento?  Tendr que desvestirse de la cintura para [place] y ponerse una bata de hospital.  Debe permanecer de pie delante de la mquina de rayos-X.  Se colocar cada mama entre dos placas de [place] unos segundos. Durante el procedimiento, intente estar lo ms relajada posible. Esto no causa ningn dao a las [place], y, si siente [employer], ser pasajera.  Se tomarn radiografas desde diferentes ngulos de cada mama. Este procedimiento puede variar segn el mdico y el hospital. Sander Nephew sucede despus del procedimiento?  La mamografa ser examinada por un especialista (radilogo).  En funcin de la calidad de [employer], es posible que tenga que repetir algunas partes del [place]. Generalmente, esto se realiza si el radilogo necesita una vista mejor del tejido [place].  Pregunte la fecha en que los resultados estarn disponibles. Asegrese de The [employer] Companies.  Puede reanudar sus actividades normales. Esta informacin no tiene Marine scientist el consejo del mdico. Chief Strategy Officer  de hacerle al mdico cualquier pregunta que tenga. Document Released: 07/03/2005 Document Revised: 12/31/2016 Document Reviewed: 12/02/2014 Elsevier Interactive Patient Education   2018 [place] en los adultos Obesity, Adult La obesidad es una afeccin que implica tener demasiada grasa corporal total. Raynelle Jan sobrepeso u obesidad significa que el peso es mayor que lo que se considera saludable para el Tour manager. La obesidad se determina por Abner Greenspan llamada [facility]. El [place] (ndice de masa corporal) es la estimacin de la grasa corporal y se calcula a partir de la altura y [place]. Si un adulto tiene un [facility] de 30 o superior se considera obeso. La obesidad puede conducir a algunos de los siguientes problemas de salud y [place] graves:  Ictus.  Enfermedad arterial coronaria (EAC).  Diabetes tipo 2.  Algunos tipos de cncer, incluido el cncer de colon, mama, tero y vescula.  Artrosis.  Presin arterial elevada (hipertensin arterial).  Colesterol elevado.  Apnea del sueo.  Clculos en la vescula.  Problemas de esterilidad.  Cules son las causas? La causa principal de la obesidad es consumir ms caloras que las que su cuerpo [place] para Risk analyst. Otros factores que contribuyen a esta condicin pueden incluir lo siguiente:  Nacer con genes que lo hacen ms propenso a ser obeso.  Tener una afeccin mdica que causa obesidad. Estos problemas incluyen los siguientes: ? Hipotiroidismo. ? Sndrome de ovario poliqustico (SOP). ? Trastorno alimentario compulsivo. ? Sndrome de [place].  Tomar ciertos medicamentos, como esteroides, antidepresivos y [place].  No ser fsicamente activo (estilo de vida sedentario).  Vivir en lugares en donde haya escasez de lugares para realizar actividad fsica de manera segura o para comprar alimentos saludables.  No dormir lo suficiente.  Qu incrementa el riesgo? Los siguientes factores pueden aumentar el riesgo de esta afeccin:  Tener antecedentes familiares de obesidad.  Ser una mujer de origen afroamericano.  Ser un hombre de origen hispano.  Cules son los signos o  los sntomas? Tener grasa corporal excesiva es el sntoma principal de esta afeccin. Cmo se diagnostica? Esta afeccin se puede diagnosticar en funcin de lo siguiente:  Sus sntomas.  Sus antecedentes mdicos.  Un examen fsico. El mdico puede medir lo siguiente: ? Su [place]. Si usted es un adulto y su [facility] es de entre 25 y 44, entonces tiene sobrepeso. Si usted es un adulto y su [place] es de 30 o ms, se considera que es obeso. ? Las medidas alrededor (circunferencias) de la cadera y de la cintura. Estas medidas pueden tenerse en cuenta para diagnosticar la afeccin. ? El grosor del pliegue cutneo. El mdico puede pellizcar suavemente un pliegue de la piel y [place].  Cmo se trata? El tratamiento de esta afeccin frecuentemente incluye cambiar el estilo de vida. El tratamiento puede incluir algunos o todos los siguientes elementos:  Cambios en la dieta. Trabaje con el mdico y un nutricionista para establecer una meta de prdida de peso que sea saludable y razonable para usted. Los cambios en la dieta pueden incluir comer lo siguiente: ? Porciones ms pequeas. El tamao de una porcin es la cantidad de alimento que se considera saludable ingerir en un determinado momento. Esto vara de Ardelia Mems persona a otra. ? Opciones de bajo contenido calrico o graso. ? Ms cereales integrales, frutas y verduras.  Realizar actividad fsica con regularidad. Esto puede incluir [place] (cardaca) y fortalecimiento muscular.  Medicamentos para ayudarlo a Administrator, Civil Service. Su mdico puede prescribir medicamentos si usted no puede  perder 1 libra por semana despus de 6 semanas de comer ms saludablemente y hacer ms actividad fsica.  Someterse a [place]. Las opciones quirrgicas pueden incluir bandas gstricas y bypass gstrico. Se puede realizar una ciruga si: ? Otros tratamientos mejoraron su afeccin. ? Tiene un IMC de 40 o superior. ? Tiene problemas de salud potencialmente mortales  relacionados con la obesidad.  Siga estas instrucciones en su casa:  Comida y bebida   Siga las indicaciones del mdico respecto de lo que puede comer o beber. El mdico puede indicarle que haga lo siguiente: ? Limitar las comidas rpidas, los dulces y los snacks procesados. ? Elegir opciones con bajo contenido de [place], como leche descremada en lugar de [place] entera. ? Consumir 5o ms porciones de frutas o verduras por da. ? Comer en casa con ms frecuencia. Esto le da ms control sobre lo que come. ? Cuando coma afuera, elija alimentos saludables. ? Aprenda cul es el tamao de una porcin saludable. ? Tenga a mano colaciones con bajo contenido de grasa. ? Evite las bebidas azucaradas, como refrescos, jugo de frutas, t helado endulzado con azcar y Thermal saborizada. ? Desayune de forma saludable.  Beba suficiente agua para mantener la orina clara o de color amarillo plido.  No deje transcurrir largos perodos de tiempo sin comer (no ayune) ni siga una dieta de [place]. El ayuno y las dietas de moda pueden ser insalubres, e incluso peligrosas. Actividad fsica  Haga actividad fsica habitualmente como se lo haya indicado el mdico. Consulte al mdico qu tipo de ejercicios es seguro para usted y con qu frecuencia debe ejercitarse.  Precaliente y elongue adecuadamente antes de la [place].  Reljese y elongue despus de realizar [place].  Descanse entre los perodos de [place]. Estilo de vida  Limite el tiempo que pasa mirando televisin, o usando una computadora o un videojuego.  Busque formas de recompensarse que no incluyan alimentos.  Limite el consumo de alcohol a no ms de 1 medida por da si es mujer y no est Music therapist y a 2 medidas por da si es hombre. Una medida equivale a 12onzas de cerveza, 5onzas de vino o 1onzas de bebidas alcohlicas de alta graduacin. Instrucciones generales  Registre la prdida de peso en un diario para Optometrist un seguimiento  de los alimentos que consume y cunto se Psychologist, forensic.  Tome los medicamentos de venta libre y los recetados solamente como se lo haya indicado el [place] vitaminas y suplementos solamente como se lo haya indicado el mdico.  Considere la posibilidad de Chief Financial Officer en un grupo de apoyo. El mdico podra recomendarle un grupo de apoyo.  Concurra a todas las visitas de control como se lo haya indicado el mdico. Esto es importante. Comunquese con un mdico si:  No puede alcanzar su objetivo de prdida de peso despus de 6 semanas de cambios en la dieta y en el estilo de vida. Esta informacin no tiene Marine scientist el consejo del mdico. Asegrese de hacerle al mdico cualquier pregunta que tenga. Document Released: 09/23/2005 Document Revised: 12/27/2016 Document Reviewed: 07/12/2015 Elsevier Interactive Patient Education  2018 [place] de caloras para bajar de peso Calorie Counting for [employer] Loss Las caloras son unidades de Teacher, early years/pre. Su cuerpo necesita una cierta cantidad de caloras de los alimentos para que le ayuden a Company secretary. Cuando come ms caloras de las que el cuerpo necesita, este acumula las caloras extra [place]. Cuando come Universal Health de  las que el cuerpo necesita, este quema grasa para obtener la energa que requiere. El recuento de caloras es el registro de la cantidad de caloras que come y Pharmacologist. El recuento de caloras puede ser de ayuda si necesita perder peso. Si se asegura de comer menos caloras de las que el cuerpo necesita, debe bajar de [place]. Pregntele al mdico cul es un peso sano para usted. Para que el recuento de caloras funcione, tendr que comer la cantidad de caloras adecuadas para usted en un da, para bajar una cantidad de peso saludable por semana. Un nutricionista puede determinar la cantidad de caloras que necesita por da y sugerirle cmo alcanzar su objetivo calrico.  Una cantidad de peso saludable  para bajar por semana suele ser [place] 1 y Ivar Drape (0,5 a 0,9kg). Esto significa con frecuencia que su ingesta diaria de caloras se debera reducir unas 500 a 750caloras.  Ingerir 1200 a 1500caloras por Administrator, Civil Service a la [place] a Administrator, Civil Service.  Ingerir de 1500 a 1800caloras por Administrator, Civil Service a la [employer] de los hombres a Administrator, Civil Service.  En qu consiste el plan? Mi objetivo es comer __________ Raenette Rover da. Si como esta cantidad de caloras por da, debo bajar unas __________ Terrall Laity. Qu debo saber acerca del recuento de caloras? A fin de alcanzar su objetivo diario de caloras, tendr que:  Averiguar cuntas caloras hay en cada alimento que le Therapist, occupational. Intente hacerlo antes de comer.  Decida la cantidad que puede comer del alimento.  Anote lo que comi y cuntas caloras tena. Esta tarea se conoce como llevar un registro de comidas.  Para perder peso con xito es importante equilibrar el recuento de caloras con un estilo de vida saludable que incluya actividad fsica de forma regular. Tenga un objetivo de 166mnutos de ejercicio moderado (como caminar) o 75 minutos de ejercicio vigoroso (como correr) todas las semanas. Dnde encuentro informacin sobre las caloras?  Es posible eAnimatorcantidad de caloras que contiene un alimento en la etiqueta de informacin nutricional. Si un alimento no tiene una etiqueta de informacin nutricional, intente buscar las caloras en Internet o pida ayuda al nutricionista. Recuerde que las caloras se calculan por porcin. Si opta por comer ms de una porcin de un alimento, tendr qTenneco Inclas caloras por porcin por la cantidad de porciones que planea comer. Por ejemplo, la etiqueta de un envase de pan puede decir que el tamao de una porcin es 1[place] y que una porcin tiene 90caloras. Si come 1rodaja, habr comido 90caloras. Si come 2rodajas, habr comido 180caloras. Cmo  llevo un registro de comidas? Despus de cada comida, registre la siguiente informacin en el registro de comidas:  Lo que comi. No olvide incluir los aderezos, las salsas y otros extras de la comida.  La cantidad que comi. Esto se puede medir en tazas, onzas o cantidad de alimentos.  Cuntas caloras ingiri por comida y por bebida.  La cantidad total de caloras en la comida.  Tenga a mMaterials engineerde comidas, por ejemplo, en un anotador de bolsillo o utilice una aplicacin mvil o sitio web. Algunos programas calcularn las caloras y lAutomotive engineerla cantidad de caloras que le quedan para llegar al objetivo diario. Cules son algunos consejos para el recuento de caloras?  Use las caloras de los alimentos y las bebidas que lo sacien y no lo dejen con apetito: ? Algunos ejemplos de alimentos que lo sacian son  los frutos secos y Engineer, mining de frutos secos, verduras, protenas magras y Clinical research associate con alto contenido de fibra como los cereales integrales. Los alimentos con alto contenido de [place] son aquellos que tienen ms de 5g de fibra por porcin. ? Las [employer] refrescos, especialmente las bebidas a base de caf y los jugos, que contienen muchas caloras, pero no le dan saciedad.  Coma alimentos nutritivos y evite las caloras vacas. Las caloras vacas son aquellas que se obtienen de los alimentos o las bebidas que no contienen muchos nutrientes ni protenas, como los dulces y los refrescos. Es mejor comer una comida nutritiva altamente calrica (como un aguacate) que una con pocos nutrientes (como una bolsa de patatas fritas).  Sepa cuntas caloras tienen los alimentos que come con ms frecuencia. Esto le ayudar a contar las caloras ms rpidamente.  Preste atencin a las [employer] Data. Las bebidas de bajas caloras incluyen agua y refrescos sin Location manager.  Preste atencin a las etiquetas nutricionales de alimentos "bajos en grasas" o "sin grasas". Estos  alimentos a veces tienen la misma cantidad de caloras o ms caloras que las versiones ricas en grasa. Con frecuencia, tambin tienen agregados de azcar, almidn o sal, para darles el sabor que fue eliminado con la grasa.  Encuentre un mtodo para controlar las caloras que funcione para usted. Sea creativo. Pruebe aplicaciones o programas distintos, si llevar un registro de las caloras no funciona para usted. Cules son algunos consejos para controlar las porciones?  Sepa cuntas caloras hay en una porcin. Esto lo ayudar a saber cuntas porciones de un alimento determinado puede comer.  Use una taza medidora para medir los tamaos de las porciones. Tambin Secondary school teacher las porciones en una balanza de cocina. Con el tiempo, podr hacer un clculo estimativo de los tamaos de las porciones de algunos alimentos.  Dedique tiempo a poner porciones de diferentes alimentos en sus platos, tazones y tazas predilectos, a fin de saber cmo se ve una porcin.  Intente no comer directamente de una bolsa o una caja. Esto puede llevarlo a comer en exceso. Ponga la cantidad [employer]akes gustara comer en una taza o un plato, a fin de asegurarse de que est comiendo la porcin correcta.  Use platos, vasos y tazones ms pequeos para no comer en exceso.  Intente no realizar varias tareas al [employer] (como mirar la TV o usar su computadora) [place] come. Si es la hora de comer, sintese a Conservation officer, nature y disfrute de Environmental education officer. Esto lo ayudar a Marine scientist cundo est satisfecho. Tambin le ayudar a tomar conciencia de lo que est comiendo y de la cantidad. Cules son algunos consejos para seguir este plan? Lectura de las etiquetas de los alimentos  Controle el recuento de caloras en comparacin con el tamao de la porcin. El tamao de la porcin puede ser ms pequeo de lo que suele comer.  Verifique la fuente de las caloras. Asegrese de que la comida que ingiere tenga alto contenido de vitaminas y  protenas y sea baja en grasas saturadas y grasas trans. De compras  Lea las etiquetas nutricionales cuando compre. Esto le ayudar a tomar decisiones ms saludables antes de comprar [employer] Energy Corporation.  Haga una lista para el almacn y resptela. La coccin  Intente cocinar sus alimentos preferidos de una manera ms saludable. Por ejemplo, pruebe hornear en vez de frer.  Utilice productos lcteos descremados. Planificacin de los alimentos  Utilice ms frutas y verduras. La mitad de sus  platos debe ser de frutas y verduras.  Incluya protenas Kerr[employer]McGee y el pescado. Cmo puedo hacer el recuento de caloras cuando como afuera?  Pida porciones ms pequeas.  Considere la posibilidad de Publishing rights manager un plato principal y las guarniciones, en lugar de pedir su propio plato principal.  Si pide su propio plato principal, coma solo la mitad. Pida una caja al comienzo de la comida y ponga all el resto del plato principal, para no sentir la tentacin de comerlo.  Si se detallan las caloras en el men, elija las opciones que contengan la menor cantidad.  Elija platos que incluyan verduras, frutas, cereales integrales, productos lcteos con bajo contenido de grasa y Advertising account planner.  Opte por los alimentos hervidos, asados, cocidos a la parrilla o al vapor. No coma alimentos que contengan mantequilla, estn empanados, fritos o que se sirvan con salsa a base de crema. Generalmente, los alimentos que se etiquetan como "crujientes" estn fritos, a menos que se indique lo contrario.  Elija el agua, la [place], [place] t helado sin azcar u otras bebidas que no contengan azcares agregados. Si desea una bebida alcohlica, escoja una opcin con menos caloras como una copa de vino o una cerveza ligera.  Ordene los Kimberly[employer], las salsas y los jarabes aparte. Estos son, con frecuencia, de alto contenido en caloras, por lo que debe limitar la cantidad que ingiere.  Si desea [place] Mantle,  elija una de hortalizas y pida carnes a la parrilla. Evite las guarniciones adicionales como el tocino, el queso o los alimentos fritos. Ordene el aderezo aparte o pida aceite de [place] y vinagre o limn para Haematologist.  Haga un clculo estimativo de la cantidad de porciones que le sirven. Por ejemplo, una porcin de arroz cocido equivale a media taza o la mitad del tamao de una pelota de bisbol. Conocer el tamao de las porciones lo ayudar a Personnel officer atento a la cantidad de comida que come Occidental Petroleum. La lista que sigue le [place] el tamao de algunas porciones comunes a partir de objetos cotidianos: ? 1onza (28g) = 4dados apilados. ? 3onzas (85g) = 46mzo de cartas. ? 1cucharadita = 1dado. ? 1cucharada = media pelota de tenis de mesa. ? 2cucharadas = 1pelota de tenis de mesa. ? Media taza = media pelota de bisbol. ? 1taza = 1 pelota de bisbol. Resumen  El recuento de caloras es el registro de la cantidad de caloras que come y bPharmacologist Si come menos caloras de las que el cuerpo necesita, debe bajar de [place]  Una cantidad de peso saludable para bajar por semana suele ser eBuck[place]1 y 2Ivar Drape(0,5 a 0,9kg). Esto significa, con frecuencia, reducir su ingesta diaria de caloras unas 500 a 750 caloras.  Es posible eAnimatorcantidad de caloras que contiene un alimento en la etiqueta de informacin nutricional. Si un alimento no tiene una etiqueta de informacin nutricional, intente buscar las caloras en Internet o pida ayuda al nutricionista.  Use las caloras de los alimentos y las bebidas que lo sacien y no de los alimentos y las bebidas que lo dejan con apetito.  Use platos, vasos y tazones ms pequeos para no comer en exceso. Esta informacin no tiene cMarine scientistel consejo del mdico. Asegrese de hacerle al mdico cualquier pregunta que tenga. Document Released: 01/09/2009 Document Revised: 12/23/2016 Document Reviewed: 12/23/2016 Elsevier  Interactive Patient Education  2018 E[place]burgpara mantenerse sano (Exercising to SUnited Stationers  Hacer actividad fsica con regularidad es muy importante. Tiene muchos otros beneficios, como por ejemplo:  Mejorar el estado fsico, la flexibilidad y la resistencia.  Aumenta la densidad sea.  Ayuda a Technical sales engineer.  Disminuye la Air traffic controller.  Aumenta la fuerza muscular.  Reduce el estrs y las tensiones.  Mejora el estado de salud general. Para estar sano y [place] as, se recomienda que haga ejercicio de intensidad moderada y de intensidad vigorosa. Puede saber que est haciendo ejercicio de intensidad moderada si tiene una frecuencia cardaca ms elevada y [place] respiracin ms rpida, pero an [place]. Puede saber que est haciendo ejercicio de intensidad vigorosa si respira con mucha ms dificultad y rapidez, y no puede Theatre manager una conversacin. [place]? Elija una actividad que disfrute y establezca objetivos realistas. El mdico puede ayudarlo a Paediatric nurse un plan de actividades que funcione para usted. Haga ejercicio regularmente como se lo haya indicado el mdico. Esta puede incluir:  Neurosurgeon de [place] veces por semana, como: ? Flexiones de Merrill Lynch. ? Abdominales. ? Levantamiento de pesas. ? Ejercicios con bandas elsticas.  Realizar una intensidad determinada de ejercicio durante una cantidad determinada de [place]. Elija entre estas opciones: ? 164mnutos de ejercicio de intensidad moderada cada semana. ? 743mutos de ejercicio de intensidad vigorosa cada semana. ? UnWaldron Labse ejercicio de intensidad moderada y vigorosa cada semana. Los nios, las mujeres emOwossolas personas que no estn en forma, las personas con sobrepeso y los adultos mayores tal vez tengan que consultar a un mdico para que les d reGlass blower/designerSi tiene alCommercial Metals Company asegrese de coTeacher, adult educationl mdico antes de comenzar un programa de ejercicios nuevo. CULES SON ALGUNAS IDEAS DE EJERCICIO? Algunas ideas de ejercicio de intensidad moderada incluyen:  Caminar a un ritmo de 1 milla (1,6 kilmetros) en 15 minutos.  Andar en bicicleta.  Hacer senderismo.  Jugar al golf.  Bailar. Algunas ideas de ejercicio de intensidad vigorosa incluyen:  Caminar a un ritmo de al menos 4,5 millas (7 kilmetros) por hora.  Trotar o correr a un ritmo de 5 millas (8 kilmetros) por hora.  Andar en bicicleta a un ritmo de al menos 10 millas (16 kilmetros) por hora.  Practicar natacin.  Practicar patinaje sobre ruedas normales o en lnea.  Hacer esqu de fondo.  Hacer deportes competitivos vigorosos, como ftbol americano, bsquet y ftbol.  Saltar la soga.  Tomar clases de baile aerbico. CULES SON ALGUNAS ACTIVIDADES DIARIAS QUE PUEDEN AYKansas Trabajo en el jardn, como: ? Empujar una cortadora de csped. ? Juntar y embolsar hojas.  Lavar y enGlass blower/designerl automvil.  Empujar un cochecito.  Palear nieve.  CuArapahoeLavar las ventanas o los pisos. CMO PUEDO SER MS ACTIVO EN MIS ACTIVIDADES DIARIAS?  Utilice las esClinical cytogeneticistel ascensor.  D una caminata durante su hora de almuerzo.  Si conduce, estacione el automvil ms lejos del trabajo o de la escuela.  Si usCanadaransporte pblico, bjese una parada antes y camine el resto del camino.  Pngase de pie y camine cada vez que haga llamadas telefnicas.  Levntese, estrese y camine cada [id number] a lo largo del da.Training and development officerU MalloryNo haga ejercicio en exceso que pudiera hacer que se lastime, se sienta mareado o tenga dificultad para respirar.  Consulte al mdico antes de comenzar un programa de ejercicios nuevo.  Use ropa cmoda y calzado  con buen soporte.  Beba gran cantidad de agua mientras hace ejercicios para  evitar la deshidratacin o los golpes de Freight forwarder. Durante la actividad fsica se pierde agua corporal que se debe reponer.  Haga ejercicio hasta que se acelere su respiracin y sus latidos cardacos. Esta informacin no tiene Marine scientist el consejo del mdico. Asegrese de hacerle al mdico cualquier pregunta que tenga. Document Released: 12/28/2010 Document Revised: 10/14/2014 Document Reviewed: 02/24/2014 Elsevier Interactive Patient Education  2018 [place] (Perimenopause) La perimenopausia es el momento en que su cuerpo comienza a pasar a la menopausia (sin menstruacin durante 12 meses consecutivos). Es un proceso natural. La perimenopausia puede comenzar entre 2 y 68 aos antes de la menopausia y por lo general tiene una duracin de 1 ao ms pasada la menopausia. [employer], los ovarios podran producir un vulo o no. Los ovarios varan su produccin de las hormonas estrgeno y Technical brewer. Esto puede causar perodos menstruales irregulares, dificultad para quedar embarazada, hemorragia vaginal entre perodos y sntomas incmodos. CAUSAS Produccin irregular de las hormonas ovricas estrgeno y Immunologist, y no ovular todos los meses. Otras causas son: Tumor de la glndula pituitaria. Enfermedades que [employer] ovarios. Radioterapia. Quimioterapia. Causas desconocidas. Fumar mucho y abusar del consumo de alcohol puede llevar a que la perimenopausia aparezca antes.  SIGNOS Y SNTOMAS Acaloramiento. Sudoracin nocturna. Perodos menstruales irregulares. Disminucin del deseo sexual. Sequedad vaginal. Dolores de cabeza. Cambios en el estado de nimo. Depresin. Problemas de memoria. Irritabilidad. Cansancio. Aumento de [place]. Problemas para quedar embarazada. Prdida de clulas seas (osteoporosis). Comienzo de endurecimiento de las arterias (aterosclerosis).  DIAGNSTICO El mdico realizar un diagnstico en funcin de su edad,  historial de perodos menstruales y sntomas. Le realizarn un examen fsico para ver si hay algn cambio en su cuerpo, en especial en sus rganos reproductores. Las pruebas hormonales pueden ser o no tiles segn la cantidad de hormonas femeninas que produzca y [employer] produzca. Sin embargo, podrn Microbiologist pruebas hormonales para Statistician. TRATAMIENTO En algunos casos, no se necesita tratamiento. La decisin acerca de qu tratamiento es necesario durante la perimenopausia deber realizarse en conjunto con su mdico segn cmo estn afectando los sntomas a su estilo de vida. Existen varios tratamientos disponibles, como: Risk manager cada sntoma individual con medicamentos especficos para ese sntoma. Algunos medicamentos herbales pueden ayudar en sntomas especficos. Psicoterapia. Terapia grupal. INSTRUCCIONES PARA EL CUIDADO EN EL HOGAR Controle sus periodos menstruales (cundo ocurren, qu tan abundantes son, cunto tiempo pasa entre perodos, y cunto duran) como tambin sus sntomas y cundo comenzaron. Tome slo medicamentos de venta libre o recetados, segn las indicaciones del mdico. Duerma y descanse. Haga actividad fsica. Consuma una dieta que contenga calcio (bueno para los [place]) y productos derivados de la soja (actan como estrgenos). No fume. Evite las bebidas alcohlicas. Tome los suplementos vitamnicos segn las indicaciones del mdico. En ciertos casos, puede ser de [place] tomar vitamina E. Tome suplementos de calcio y vitamina D para ayudar a Publishing rights manager prdida sea. En algunos casos la terapia de grupo podr ayudarla. La acupuntura puede ser de ayuda en ciertos casos.  SOLICITE ATENCIN MDICA SI: Tiene preguntas acerca de sus sntomas. Necesita ser derivada a un especialista (gineclogo, psiquiatra, o psiclogo).  SOLICITE ATENCIN MDICA DE INMEDIATO SI: Sufre una hemorragia vaginal abundante. Su perodo menstrual dura ms de 8 das. Sus  perodos son recurrentes cada menos de [address] Tiene hemorragias durante las [employer]. Est muy deprimido. Siente dolor  al [place]. Siente dolor de cabeza intenso. Tiene problemas de visin.  Esta informacin no tiene Marine scientist el consejo del mdico. Asegrese de hacerle al mdico cualquier pregunta que tenga. Document Released: 09/23/2005 Document Revised: 07/14/2013 Document Reviewed: 04/22/2013 Elsevier Interactive Patient Education  2017 [employer].

## 2018-02-05 LAB — TEST AUTHORIZATION

## 2018-02-05 LAB — HIV ANTIBODY (ROUTINE TESTING W REFLEX): HIV 1&2 Ab, 4th Generation: NONREACTIVE

## 2018-02-06 ENCOUNTER — Encounter: Payer: Self-pay | Admitting: Family Medicine

## 2018-02-06 LAB — HEMOGLOBIN A1C W/OUT EAG: Hgb A1c MFr Bld: 5.9 % of total Hgb — ABNORMAL HIGH (ref <5.7)

## 2018-02-06 NOTE — Progress Notes (Signed)
Hemoglobin A1c is 5.9, prediabetes HIV screening was negative CMP shows elevated LFTs which will need further work-up (alk phos mildly elevated, AST mildly elevated, ALT 2x + normal level) CBC is normal LDLs mildly elevated Patient will need to come in for another appointment to discuss lab results

## 2018-02-11 ENCOUNTER — Encounter: Payer: Self-pay | Admitting: Family Medicine

## 2018-02-11 ENCOUNTER — Ambulatory Visit (INDEPENDENT_AMBULATORY_CARE_PROVIDER_SITE_OTHER): Payer: BLUE CROSS/BLUE SHIELD | Admitting: Family Medicine

## 2018-02-11 VITALS — BP 144/90 | HR 76 | Temp 98.1°F | Resp 14 | Ht 62.5 in | Wt 188.0 lb

## 2018-02-11 DIAGNOSIS — R7989 Other specified abnormal findings of blood chemistry: Secondary | ICD-10-CM | POA: Insufficient documentation

## 2018-02-11 DIAGNOSIS — R945 Abnormal results of liver function studies: Secondary | ICD-10-CM | POA: Diagnosis not present

## 2018-02-11 DIAGNOSIS — E782 Mixed hyperlipidemia: Secondary | ICD-10-CM | POA: Diagnosis not present

## 2018-02-11 DIAGNOSIS — E669 Obesity, unspecified: Secondary | ICD-10-CM

## 2018-02-11 DIAGNOSIS — R7303 Prediabetes: Secondary | ICD-10-CM

## 2018-02-11 NOTE — Progress Notes (Signed)
Patient ID: Latoya [name], female    DOB: [birth year], 47 y.o.   MRN: [id number]  PCP: Delsa Grana, PA-C  Chief Complaint  Patient presents with  . Elevated Hepatic Enzymes    Subjective:   Latoya [name] is a 47 y.o. female, presents to clinic with Triplett Foxworth translator to review abnormal labs including elevated LDL, elevated LFTs, and prediabetes, found on her well visit from 1 week ago.  She does have a history of prediabetes and dyslipidemia, both these improved in 2017 with diet and exercise.  Per chart review she had one mildly elevated LFT in 2017, but never AST, ALT and alk phos elevated at the same time.  Per her history at her last visit patient is not taking any medication, not even Tylenol, she does not have any recent illnesses or exposures to illnesses.  She has not traveled to [place] in the past year but over a year ago she has frequently traveled to [place].  She notes being treated for tuberculosis more than 3 years ago and was on 3 months of medication however she does not know the name.  She has no other history of autoimmune disease, alcohol use, substance abuse, IV drug use. In her diet she typically eats a lot of fruits and smoothies with some leafy greens added, she likes fish and poultry, and does not often eat red meats.  Per our visit 1 week ago she is trying to exercise at the gym couple times a week but has been working long hours every day the week and has been little worn down, but is trying to go to the gym more often.   She has no abdominal pain, urinary symptoms, rashes.    Patient Active Problem List   Diagnosis Date Noted  . Obesity (BMI 30.0-34.9) 02/04/2018  . Hyperlipidemia 05/29/2016  . Prediabetes 05/29/2016     Prior to Admission medications   Medication Sig Start Date End Date Taking? Authorizing Provider  Multiple Vitamins-Minerals (MULTIVITAMIN WITH MINERALS) tablet Take 1 tablet by mouth daily. 02/04/18  Yes  Delsa Grana, PA-C     No Known Allergies   Family History  Problem Relation Age of Onset  . Cancer Mother        uterine  . Diabetes Mother   . Hypertension Mother   . Diabetes Father   . Hypertension Father   . Cancer Father        stomach cancer ?  Marland Kitchen Heart disease Father   . Diabetes Sister      Social History   Socioeconomic History  . Marital status: Married    Spouse name: Not on file  . Number of children: Not on file  . Years of education: Not on file  . Highest education level: Not on file  Occupational History  . Not on file  Social Needs  . Financial resource strain: Not on file  . Food insecurity:    Worry: Not on file    Inability: Not on file  . Transportation needs:    Medical: Not on file    Non-medical: Not on file  Tobacco Use  . Smoking status: Never Smoker  . Smokeless tobacco: Never Used  Substance and Sexual Activity  . Alcohol use: Yes    Comment: rarely  . Drug use: No  . Sexual activity: Yes    Partners: Male    Birth control/protection: Surgical    Comment: Monogamous with husband  Lifestyle  . Physical activity:    Days per week: 3 days    Minutes per session: 60 min  . Stress: Not at all  Relationships  . Social connections:    Talks on phone: Not on file    Gets together: Not on file    Attends religious service: Not on file    Active member of club or organization: Not on file    Attends meetings of clubs or organizations: Not on file    Relationship status: Not on file  . Intimate partner violence:    Fear of current or ex partner: Not on file    Emotionally abused: Not on file    Physically abused: Not on file    Forced sexual activity: Not on file  Other Topics Concern  . Not on file  Social History Narrative  . Not on file     Review of Systems  All other systems reviewed and are negative.      Objective:    Vitals:   02/11/18 0810  BP: (!) 144/90  Pulse: 76  Resp: 14  Temp: 98.1 F (36.7 C)    TempSrc: Oral  SpO2: 97%  Weight: 188 lb (85.3 kg)  Height: 5' 2.5" (1.588 m)      Physical Exam  Constitutional: She is oriented to person, place, and time. She appears well-developed and well-nourished.  Non-toxic appearance. No distress.  HENT:  Head: Normocephalic and atraumatic.  Right Ear: External ear normal.  Left Ear: External ear normal.  Nose: Nose normal.  Mouth/Throat: Uvula is midline, oropharynx is clear and moist and mucous membranes are normal.  Eyes: Pupils are equal, round, and reactive to light. Conjunctivae, EOM and lids are normal. Right eye exhibits no discharge. Left eye exhibits no discharge. No scleral icterus.  Neck: Normal range of motion and phonation normal. Neck supple. No JVD present. No tracheal deviation present.  Cardiovascular: Normal rate, regular rhythm, normal heart sounds and normal pulses. Exam reveals no gallop and no friction rub.  No murmur heard. Pulses:      Radial pulses are 2+ on the right side, and 2+ on the left side.       Posterior tibial pulses are 2+ on the right side, and 2+ on the left side.  Pulmonary/Chest: Effort normal and breath sounds normal. No stridor. No respiratory distress. She has no wheezes. She has no rhonchi. She has no rales. She exhibits no tenderness.  Abdominal: Soft. Normal appearance and bowel sounds are normal. She exhibits no distension, no pulsatile liver, no fluid wave, no abdominal bruit, no ascites, no pulsatile midline mass and no mass. There is no hepatosplenomegaly, splenomegaly or hepatomegaly. There is no tenderness. There is no rigidity, no rebound, no guarding, no CVA tenderness, no tenderness at McBurney's point and negative Murphy's sign. No hernia.  Musculoskeletal: Normal range of motion. She exhibits no edema or deformity.  Lymphadenopathy:    She has no cervical adenopathy.  Neurological: She is alert and oriented to person, place, and time. She exhibits normal muscle tone. Gait normal.   Skin: Skin is warm, dry and intact. Capillary refill takes less than 2 seconds. No rash noted. She is not diaphoretic. No pallor.  Psychiatric: She has a normal mood and affect. Her speech is normal and behavior is normal.  Nursing note and vitals reviewed.    Recent Results (from the past 2160 hour(s))  COMPLETE METABOLIC PANEL WITH GFR     Status: Abnormal  Collection Time: 02/04/18  9:42 AM  Result Value Ref Range   Glucose, Bld 93 65 - 99 mg/dL    Comment: .            Fasting reference interval .    BUN 8 7 - 25 mg/dL   Creat 0.66 0.50 - 1.10 mg/dL   GFR, Est Non African American 104 > OR = 60 mL/min/1.72m   GFR, Est African American 121 > OR = 60 mL/min/1.79m  BUN/Creatinine Ratio NOT APPLICABLE 6 - 22 (calc)   Sodium 138 135 - 146 mmol/L   Potassium 4.3 3.5 - 5.3 mmol/L   Chloride 103 98 - 110 mmol/L   CO2 27 20 - 32 mmol/L   Calcium 9.4 8.6 - 10.2 mg/dL   Total Protein 7.1 6.1 - 8.1 g/dL   Albumin 4.3 3.6 - 5.1 g/dL   Globulin 2.8 1.9 - 3.7 g/dL (calc)   AG Ratio 1.5 1.0 - 2.5 (calc)   Total Bilirubin 0.5 0.2 - 1.2 mg/dL   Alkaline phosphatase (APISO) 122 (H) 33 - 115 U/L   AST 43 (H) 10 - 35 U/L   ALT 74 (H) 6 - 29 U/L  CBC with Differential     Status: None   Collection Time: 02/04/18  9:42 AM  Result Value Ref Range   WBC 7.3 3.8 - 10.8 Thousand/uL   RBC 4.49 3.80 - 5.10 Million/uL   Hemoglobin 13.8 11.7 - 15.5 g/dL   HCT 41.3 35.0 - 45.0 %   MCV 92.0 80.0 - 100.0 fL   MCH 30.7 27.0 - 33.0 pg   MCHC 33.4 32.0 - 36.0 g/dL   RDW 12.1 11.0 - 15.0 %   Platelets 346 140 - 400 Thousand/uL   MPV 10.8 7.5 - 12.5 fL   Neutro Abs 3,774 1,500 - 7,800 cells/uL   Lymphs Abs 2,847 850 - 3,900 cells/uL   WBC mixed population 475 200 - 950 cells/uL   Eosinophils Absolute 131 15 - 500 cells/uL   Basophils Absolute 73 0 - 200 cells/uL   Neutrophils Relative % 51.7 %   Total Lymphocyte 39.0 %   Monocytes Relative 6.5 %   Eosinophils Relative 1.8 %   Basophils  Relative 1.0 %  Lipid Panel     Status: Abnormal   Collection Time: 02/04/18  9:42 AM  Result Value Ref Range   Cholesterol 180 <200 mg/dL   HDL 56 >50 mg/dL   Triglycerides 118 <150 mg/dL   LDL Cholesterol (Calc) 103 (H) mg/dL (calc)    Comment: Reference range: <100 . Desirable range <100 mg/dL for primary prevention;   <70 mg/dL for patients with CHD or diabetic patients  with > or = 2 CHD risk factors. . Marland KitchenDL-C is now calculated using the Martin-Hopkins  calculation, which is a validated novel method providing  better accuracy than the Friedewald equation in the  estimation of LDL-C.  MaCresenciano Genret al. JAAnnamaria [id number]  (http://education.[facility].com/faq/FAQ164)    Total CHOL/HDL Ratio 3.2 <5.0 (calc)   Non-HDL Cholesterol (Calc) 124 <130 mg/dL (calc)    Comment: For patients with diabetes plus 1 major ASCVD risk  factor, treating to a non-HDL-C goal of <100 mg/dL  (LDL-C of <70 mg/dL) is considered a therapeutic  option.   HIV antibody     Status: None   Collection Time: 02/04/18  9:42 AM  Result Value Ref Range   HIV 1&2 Ab, 4th Generation NON-REACTIVE NON-REACTI    Comment: HIV-1 antigen  and HIV-1/HIV-2 antibodies were not detected. There is no laboratory evidence of HIV infection. Marland Kitchen PLEASE NOTE: This information has been disclosed to you from records whose confidentiality may be protected by state law.  If your state requires such protection, then the state law prohibits you from making any further disclosure of the information without the specific written consent of the person to whom it pertains, or as otherwise permitted by law. A general authorization for the release of medical or other information is NOT sufficient for this purpose. . For additional information please refer to http://education.[facility].com/faq/FAQ106 (This link is being provided for informational/ educational purposes only.) . Marland Kitchen The performance of this assay  has not been clinically validated in patients less than 64 years old. .   TEST AUTHORIZATION     Status: None   Collection Time: 02/04/18  9:42 AM  Result Value Ref Range   TEST NAME: HEMOGLOBIN A1c    TEST CODE: [id number]    CLIENT CONTACT: Learta Codding    REPORT ALWAYS MESSAGE SIGNATURE      Comment: . The laboratory testing on this patient was verbally requested or confirmed by the ordering physician or his or her authorized representative after contact with an employee of [employer]. Federal regulations require that we maintain on file written authorization for all laboratory testing.  Accordingly we are asking that the ordering physician or his or her authorized representative sign a copy of this report and promptly return it to the client service representative. . . Signature:____________________________________________________ . Please fax this signed page to [phone] or return it via your [employer] courier.   Hemoglobin A1C w/out eAG     Status: Abnormal   Collection Time: 02/04/18  9:42 AM  Result Value Ref Range   Hgb A1c MFr Bld 5.9 (H) <5.7 % of total Hgb    Comment: For someone without known diabetes, a hemoglobin  A1c value between 5.7% and 6.4% is consistent with prediabetes and should be confirmed with a  follow-up test. . For someone with known diabetes, a value <7% indicates that their diabetes is well controlled. A1c targets should be individualized based on duration of diabetes, age, comorbid conditions, and other considerations. . This assay result is consistent with an increased risk of diabetes. . Currently, no consensus exists regarding use of hemoglobin A1c for diagnosis of diabetes for children. .   TSH     Status: None   Collection Time: 02/04/18  9:47 AM  Result Value Ref Range   TSH 0.80 mIU/L    Comment:           Reference Range .           > or = 20 Years  0.40-4.50 .                Pregnancy Ranges           First  trimester    0.26-2.66           Second trimester   0.55-2.73           Third trimester    0.43-2.91     Lab Results  Component Value Date   AST 43 (H) 02/04/2018   AST 29 06/14/2016   ALT 74 (H) 02/04/2018   ALT 36 (H) 06/14/2016      Assessment & Plan:      ICD-10-CM   1. Elevated LFTs R94.5 Hepatitis panel, acute    COMPLETE METABOLIC PANEL WITH GFR  2.  Obesity (BMI 30.0-34.9) N27.7 COMPLETE METABOLIC PANEL WITH GFR    CBC with Differential    Lipid panel    Hemoglobin A1c  3. Prediabetes O24.23 COMPLETE METABOLIC PANEL WITH GFR    Hemoglobin A1c  4. Mixed hyperlipidemia E78.2 Lipid panel     46 year old female with abnormal lab work after CPE and establishing to care 1 week ago.  Patient has a past medical history of prediabetes and mixed hyperlipidemia, she does continue to show some of these abnormalities in her lab work.  Hemoglobin A1c is 5.9, fasting lipids is pertinent for elevated LDL but all others in optimal ranges.  Discussed with her these results and encouraged diet and exercise to treat and improve them.  She has been successful at this in the past and she is very motivated to be healthy and well. No abnormal results for her as elevated LFTs including elevated AST, ALT and alk phos. ALT is noted to be greater than 2 times the upper normal limits and others are just mildly elevated.  She has nothing obvious in her history which would be the etiology.  She does not drink alcohol, no recent medications, she even denies Tylenol, no IV drug use.  She does travel out of the country to [place] however no travel in the last year.  Most recent lab work is copied into the chart above and is June 14, 2016 where she had very mild elevation of ALT.  She has no symptoms of this.  We will work-up with ruling out hepatitis, if negative will get imaging of the liver to rule out fatty liver.  Bilirubin is normal, do not suspect any cholestatic aspect of her abnormal LFTs.  Her diet  including whether or not she should add honey to her smoothies that she drinks multiple times a day.  I advised her to limit simple sugars, increase whole grains, whole fruits, vegetables, lean meats, avoid trans-fat, add supplements like fish oils, stick to oils like all of oil etc.  Seems like she does drink a lot of smoothies containing fruits and some with oatmeal throughout most the day and have advised her to add protein sources to her diet in the morning.  With translator all of these questions take a tremendous amount of time to sort out with her I have printed multiple resources for her for diet, exercise, elevated LFTs, elevated cholesterol, prediabetes for her in [place].  Will try to get nutritional referral to see if this will help her treat these all with her diet.    Inquires about mammogram, we will follow-up on that.  Was ordered at her last visit.  Will do more labs today to work-up elevated LFTs, follow-up pending those results.  Otherwise, patient is doing well, feels she will improve muscular abnormalities with diet and exercise, and we can do a six-month follow-up appointment with repeated lab work to monitor prediabetes, cholesterol, obesity.   BP repeated in room by me, manual, 125/82 LA  Delsa Grana, PA-C 02/11/18 8:34 AM

## 2018-02-11 NOTE — Patient Instructions (Addendum)
Some more live blood test, and if they are normal we will order an ultrasound.  Return in 6 months for basic labs and recheck of cholesterol, sugars, and liver tests   If she wants to come earlier for weight or sugar tests we can in 3 months  Pruebas funcionales hepticas (Liver Function Tests)  Las pruebas funcionales hepticas son anlisis de [place] para ver si el hgado est funcionando bien. Las protenas y las enzimas que se miden en esta prueba pueden alertar al mdico sobre la presencia de enfermedad heptica, o de inflamacin o dao en el hgado. Es comn realizarse pruebas funcionales hepticas:  Durante los exmenes fsicos anuales.  Cuando est tomando ciertos medicamentos.  Si tiene una enfermedad heptica.  Si bebe mucho alcohol.  Si no se siente bien.  Si tiene otros trastornos que pueden Radiographer, therapeutic. Las sustancias que se miden pueden incluir las siguientes:  Alanina aminotransferasa (ALT). Es una enzima heptica.  Aspartato aminotransferasa (AST). Es una enzima que se encuentra en el hgado, el corazn y los msculos.  Fosfatasa alcalina (FA). Es una protena que se encuentra en el hgado, las vas biliares y [place]. Tambin est presente en otros tejidos corporales.  Bilirrubina total. Es un pigmento de color amarillo en la bilis.  Albmina. Es una protena que se encuentra en el hgado.  Tiempo de protrombina (TP) e ndice internacional normalizado (IIN). El TP mide el tiempo que demora la sangre en coagularse. El IIN es un clculo del tiempo de coagulacin de la sangre, en funcin de los [place] del [place]. Tambin se calcula sobre la base de valores normales definidos por el laboratorio que proces su anlisis.  Protena total. Mide el nivel de dos protenas que se encuentran en la sangre: albmina y globulina. PREPARACIN PARA EL ESTUDIO La preparacin depender de las pruebas que se realicen y del motivo por el cual se estn realizando. Puede ser  necesario que:  No ingiera nada durante las 4 o 6 horas previas a la prueba o segn se lo haya indicado el mdico.  Antes de realizarse el anlisis de [place], deje de tomar ciertos medicamentos segn se lo haya indicado el mdico. RESULTADOS Es su responsabilidad retirar el resultado del [place]. Consulte en el laboratorio o en el departamento en el que fue realizado el estudio cundo y cmo podr [employer]. Comunquese con el mdico si tiene CSX Corporation. [place] rangos para los valores normales pueden variar entre diferentes laboratorios y hospitales. Siempre debe consultar a su mdico despus de Dispensing optician un anlisis u otros estudios para Armed forces logistics/support/administrative officer significado de los resultados de las pruebas y si los valores estn dentro de los lmites normales. Los siguientes son rangos normales para las sustancias que se miden en las pruebas funcionales hepticas: ALT  Bebs: sus valores pueden ser dos veces ms altos que Baxter International.  Nios o adultos: de 4 a 36 unidades internacionales/l a 37C o de 4 a 36 unidades/l (unidades SI).  Ancianos: pueden presentar valores levemente ms Citigroup. AST  Recin nacidos hasta los 5 das de vida: de 35 a 140 unidades/l.  Nios menores de 3 aos: de 15 a 60 unidades/l.  De 3 a 6 aos: de 15 a 50 unidades/l.  De 6 a 12 aos: de 10 a 50 unidades/l.  De 12 a 18 aos: de 10 a 40 unidades/l.  Adultos: de 0 a 35 unidades/l o de 0 a 0,58  microkatal/l (unidades SI).  Ancianos: valores levemente ms altos que Baxter International. FA  Nios menores de 2 aos: de 85 a 235 unidades/l.  De 2 a 8 aos: de 65 a 210 unidades/l.  De 9 a 15 aos: de 60 a 300 unidades/l.  De 16 a 21 aos: de 30 a 200 unidades/l.  Adultos: de 30a120unidades/l o 0,5a2,9mcrokatal/l (unidadesSI).  Ancianos: valores levemente ms altos que eBaxter International Bilirrubina total.  Recin nacidos: de 1,0 a 12,0  mg/dl o 17,1 a 205 micromoles/l (unidades SI).  Adultos, ancianos o nios: de 0,3 a 1,0 mg/dl o 5,1 a 17 micromoles/l. Albmina  Bebs prematuros: de 3,0 a 4,2 g/dl.  Recin nacidos: de 3,5 a 5,4 g/dl.  Bebs: de 4,4 a 5,4 g/dl.  Nios: de 4,0 a 5,9 g/dl.  Adultos o ancianos: de 3,5 a 5,0 g/dl o 35 a 50 g/l (unidades SI). TP  de 11,0 a 12,5 segundos; de 85% a 100%. IIN  de 0,8 a 1,1. Protena total  Bebs prematuros: de 4,2 a 7,6 g/dl.  Recin nacidos: de 4,6 a 7,4 g/dl.  Bebs: de 6,0 a 6,7 g/dl.  Nios: de 6,2 a 8,0 g/dl.  Adultos o ancianos: de 6,4 a 8,3g/dl o de 660a 83g/l (unidades SI). SIGNIFICADO DE LOS RESULTADOS QUE ESTN FBlanketDE LOS RANGOS DE VALORES NORMALES En algunos casos, los rDanaher Corporationpruebas pueden ser aConstellation Brandsdebido a otros factores, como medicamentos, ejercicio o eMedia planner Consulte al mdico si tiene pCSX Corporationde las pruebas que estn fuera de los rangos de vShannon Colony ALT  Los niveles que estn por encima del rango normal pueden indicar, junto con los rStuartde otras pruebas, enfermedad heptica. AST  Los niveles que estn por encima del rango normal pueden indicar, junto con los rHillcrest Heightsde otras pruebas, enfermedad heptica. A veces, los niveles tambin aumentan despus de sufrir quemaduras, infarto de miocardio, dao muscular o convulsiones, o despus de uQatar FA  Los niveles superiores al rango normal pueden indicar, junto con los resultados de otras pruebas, colestasis, enfermedades hepticas, enfermedad sea, enfermedad de la tiroides, tumores, fracturas, leucemia o linfoma, u otras enfermedades. Las pEngineer, manufacturingcon sangre del grupo O o B pueden mostrar niveles ms altos despus de ingerir una comida grasosa.  Los niveles inferiores al rango normal pueden indicar, junto con los rBox Springsde otras pruebas, enfermedades seas o trastornos dentales, desnutricin, deficiencia de protenas o  enfermedad de [place] Bilirrubina total.  Los niveles que estn por encima del rango normal pueden indicar, junto con los rSanta Susanade otras pruebas, problemas en el hgado, la vescula biliar o las vas biliares. Albmina  Los nWalgreenestn por encima del rango normal pueden indicar, junto con los rCaminode oLuray deshidratacin. Tambin pueden ser causados por una dieta con alto contenido de protenas. A veces, la banda que se coloca alrededor de la parte superior del brazo durante el proceso de extraccin de sangre puede producir un aumento en el nivel de esta protena en la sMineral Point y arrojar un resultado superior al rango normal.  Los niveles que estn por debajo del nivel normal pueden indicar, junto con los rGeyservillede otras pruebas, enfermedad renal, enfermedad heptica o malabsorcin de nutrientes. IIN y TP  Los niveles superiores al rango normal implican que la sangre se est coagulando ms lento que lo normal. Esto puede deberse a trastornos sanguneos, trastornos hepticos o niveles bajos de vitamina K. Protena total  Los niveles que estn  por encima del rango normal pueden indicar, junto con los resultados de otras pruebas, infeccin u [place].  Los niveles inferiores al rango normal, junto con los [place] de otras pruebas, pueden deberse a un trastorno del sistema inmunitario, hemorragia, [place], trastorno renal, enfermedad heptica, problema para absorber u obtener suficientes nutrientes, u otras enfermedades que CIGNA intestinos. Esta informacin no tiene Marine scientist el consejo del mdico. Asegrese de hacerle al mdico cualquier pregunta que tenga. Document Released: 07/14/2013 Document Revised: 10/14/2014 Document Reviewed: 01/27/2014 Elsevier Interactive Patient Education  2018 [place] en grasas y Research officer, trade union Fat and Cholesterol Restricted Diet  Los niveles altos de grasa y colesterol en la sangre  pueden causar varios problemas de [place], como enfermedades del corazn, de los vasos sanguneos, de la vescula biliar, del hgado y del pncreas. Las grasas son fuentes de Teacher, early years/pre concentrada que existen en varias formas. Consumir en exceso ciertos tipos de grasa, incluidas las grasas saturadas, puede ser perjudicial. El colesterol es una sustancia que el organismo necesita en pequeas cantidades. El cuerpo [place] todo el colesterol que necesita. El exceso de colesterol proviene de los alimentos que come. Si sus niveles de colesterol y grasas saturadas en la sangre son elevados, puede tener problemas de salud, dado que el exceso de grasa y Research officer, trade union se acumula en las paredes de los vasos sanguneos, provocando su Public librarian. Elegir los Retail buyer su ingesta de grasa y [place]. Esto le ayudar a ALLTEL Corporation de estas sustancias en la sangre dentro de los lmites normales y a reducir el riesgo de Public librarian. En qu consiste el plan? El mdico le recomienda que:  Limite la ingesta de grasas a alrededor del _______% del total de caloras por da.  Limite la cantidad de colesterol en su dieta a menos de _________mg Darlin Coco.  Consuma entre 43 y 29gramos de Corning Incorporated.  Qu tipos de grasas debo elegir?  Elija grasas saludables con mayor frecuencia. Elija las grasas monoinsaturadas y poliinsaturadas, como el aceite de oliva y canola, las semillas de [place], las nueces, las almendras y las semillas.  Consuma ms grasas omega-3. Las mejores opciones incluyen salmn, caballa, sardinas, atn, aceite de lino y semillas de lino molidas. Trate de consumir pescado al ToysRus veces por semana.  Limite el consumo de grasas saturadas. Estas se encuentran principalmente en los productos de origen animal, como las carnes, la [employer] y la crema. Las grasas saturadas de origen vegetal incluyen aceite de palma, de palmiste y de coco.  Evite  los alimentos con aceites parcialmente hidrogenados. Estos contienen grasas trans. Entre los ejemplos de alimentos con grasas trans se incluyen margarinas en barra, algunas margarinas untables, galletas dulces o saladas y otros productos horneados. Qu pautas generales debo seguir? Estas pautas para una alimentacin saludable le ayudarn a controlar su ingesta de grasa y colesterol:  Lea las etiquetas de los alimentos detenidamente para identificar los que contienen grasas trans o altas cantidades de grasas saturadas.  Llene la mitad del plato con verduras y ensaladas de hojas verdes.  Llene un cuarto del plato con cereales integrales. Busque la palabra "integral" en Equities trader de la lista de ingredientes.  Llene un cuarto del plato con alimentos con protenas magras.  [place] a dos porciones por da. Elija frutas en lugar de jugos.  Coma ms alimentos que contienen fibra, como [place], [place], [place], frijoles, guisantes y [place].  Consuma ms comida casera y  menos de restaurante, de buf y comida rpida.  Limite o evite el alcohol.  Limite los alimentos con alto contenido de almidn y Location manager.  Limite el consumo de alimentos fritos.  Cocine los alimentos utilizando mtodos que no sean la fritura. Las opciones de coccin ms [place] son Teacher, music, Adult nurse, Interior and spatial designer y asar a Administrator, arts.  Baje de peso si es necesario. Perder solo del 5 al 10% de su peso inicial puede ayudarle a mejorar su estado de salud general y a Producer, television/film/video, como la diabetes y las enfermedades cardacas.  Qu alimentos puedo comer? Cereales  Cereales integrales, como los panes de salvado o [place], las [place], los cereales y las pastas. Avena sin endulzar, trigo, [place], quinua o arroz integral. Tortillas de harina de maz o de salvado. Verduras  Verduras frescas o congeladas (crudas, al vapor, asadas o grilladas). Ensaladas de hojas verdes. Lambert Mody  Lambert Mody frescas, en  conserva (en su jugo natural) o frutas congeladas. Carnes y otros alimentos con protenas.  Carne de res molida (al 85% o ms [place]), carne de res de animales alimentados con pastos o carne de res sin la grasa. Pollo o pavo sin piel. Carne de pollo o de [place]. Cerdo sin la grasa. Todos los pescados y frutos de mar. Huevos. Porotos, guisantes o lentejas secos. Frutos secos o semillas sin sal. Frijoles secos o en lata sin sal. Lcteos  Productos lcteos con bajo contenido de grasas, como [place] o al 1%, quesos reducidos en grasas o al 2%, ricota con bajo contenido de grasas o [employer]ere [employer], o yogur natural con bajo contenido de [place]. Grasas y [employer] untables que no contengan grasas trans. [place] y condimentos para ensaladas livianos o reducidos en grasas. [place]. Aceites de oliva, canola, ssamo o crtamo. [place] natural de cacahuate o almendra (elija la que no tenga aceite o azcares agregados). Los artculos mencionados arriba pueden no ser Dean Foods Company de las bebidas o los alimentos recomendados. Comunquese con el nutricionista para conocer ms opciones. Alimentos que se deben evitar Cereales  Pan blanco. Pastas blancas. Arroz blanco. Pan de maz. Bagels, pasteles y croissants. Galletas saladas que contengan grasas trans. Verduras  Papas blancas. MazHolland Commons con crema o fritas. Verduras en [place]. Lambert Mody  Frutas secas. Fruta enlatada en almbar liviano o espeso. Jugo de frutas. Carnes y otros alimentos con protenas.  Cortes de carne con grasa. Costillas, alas de pollo, tocineta, salchicha, mortadela, salame, chinchulines, tocino, perros calientes, salchichas alemanas y embutidos envasados. Hgado y otros rganos. Lcteos  Leche entera o al 2%, crema, mezcla de [place] y crema, y queso crema. Quesos enteros. Yogur entero o endulzado. Quesos con toda su grasa. Cremas no lcteas y coberturas batidas. Quesos procesados, quesos para  untar o cuajadas. Bebidas  Alcohol. Bebidas endulzadas (como refrescos, limonadas y bebidas frutales o ponches). Grasas y aceites  [place], [place] en barra, [place] de [place], [place], [place] clarificada o grasa de tocino. Aceites de coco, de palmiste o de palma. Dulces y postres  Jarabe de maz, azcares, miel y Control and instrumentation engineer. Caramelos. Mermelada y [place]. Chrissie Noa. Cereales endulzados. Galletas, pasteles, bizcochuelos, donas, muffins y helado. Los artculos mencionados arriba pueden no ser Dean Foods Company de las bebidas y los alimentos que se Higher education careers adviser. Comunquese con el nutricionista para obtener ms informacin. Esta informacin no tiene Marine scientist el consejo del mdico. Asegrese de hacerle al mdico cualquier pregunta que tenga. Document Released: 09/23/2005 Document Revised: 12/12/2016 Document Reviewed: 12/22/2013 Elsevier Interactive Patient Education  2018 [place] Cholesterol El colesterol es una sustancia [place], [place], similar a la grasa, que el cuerpo necesita en pequeas cantidades. El hgado fabrica todo el colesterol que el cuerpo necesita. La sangre transporta el colesterol desde el hgado a travs de los vasos sanguneos. Los depsitos de colesterol (placas) podran acumularse en las paredes de los vasos sanguneos (arterias). Las Occupational hygienist y la rigidez Morrison arterias. Las placas de colesterol aumentan el riesgo de infarto de miocardio y accidente cerebrovascular. Aunque sea muy elevado, la concentracin de colesterol no puede percibirse. La nica forma de saber que tiene colesterol alto es mediante un anlisis de [place]. Una vez que se conocen las concentraciones de Research officer, trade union, se Mining engineer un registro de los [place] de [place]. Trabaje con el mdico para SPX Corporation en el rango deseado. [place] significan los resultados?  El colesterol total es una medida general de todo el colesterol en  [place].  El colesterol LDL (lipoprotenas de baja densidad) es el colesterol "malo". Este tipo es el que hace que se acumulen placas en las paredes de las arterias. Su concentracin debe ser baja.  El colesterol HDL (lipoprotenas de alta densidad) es el colesterol "bueno", ya que limpia las arterias y Geneticist, molecular el LDL. Su concentracin debe ser alta.  Los triglicridos son grasas que el cuerpo puede quemar ya sea como fuente de energa o para Financial controller. Las concentraciones altas estn estrechamente vinculadas con las enfermedades cardacas. Cules son las concentraciones de colesterol deseadas?  El colesterol total debe estar por debajo de 200.  Para las personas con riesgo, lo aconsejable es mantener el nivel de LDL por debajo de 100; y, para las personas con alto riesgo, es por debajo de 7.  Se aconseja mantener un nivel de HDL es por encima de 40. Se considera que un nivel de 60 o superior protege contra las enfermedades cardacas.  Los triglicridos por debajo de 150. Cmo puedo bajar el colesterol? Dieta Siga el programa de alimentacin que el mdico le indique.  Elija el pescado o la carne blanca de pollo y [place], asados u horneados. Limite los cortes grasos de carne roja, los alimentos fritos y las carnes procesadas, como las salchichas y los embutidos.  Coma gran cantidad de frutas y verduras frescas.  Elija los cereales integrales, los frijoles, las pastas, las papas y los cereales.  Elija aceite de oliva, aceite de maz o aceite de canola, y solo use poca cantidad.  No coma mantequilla, [place], margarina o aceites de [place].  Evite los alimentos que contengan grasas trans.  Tome USG Corporation o sin grasa y coma yogur y quesos descremados o sin grasa. Evite la Mattel, la crema, los [place], las yemas de [place] y los quesos enteros.  Los postres sanos incluyen la torta ngel, los bocadillos de jengibre, las Administrator con forma de [place], los caramelos duros,  los helados de agua y el yogur helado descremado o semidescremado. Evite las [place], tortas, pasteles y [place].  La prctica de actividad fsica.  Siga el programa de ejercicios que le haya indicado el mdico. Programa regular: ? Ayuda a bajar el colesterol LDL y a aumentar el HDL. ? Ayuda a Technical sales engineer.  Haga cosas que aumenten el nivel de [place]; por [place], haga los trabajos de [place], salga a caminar o use las escaleras.  Pregunte al H[employer] Block formas de aumentar la actividad en la vida diaria.  Medicamentos  Delphi de [place] y  los recetados solamente como se lo haya indicado el mdico. ? El mdico puede recetarle medicamentos para ayudar a Advertising copywriter y reducir el riesgo de enfermedades cardacas. Por lo general, esto se hace si con la dieta y la actividad fsica no se logra disminuir el nivel de colesterol. ? Si tiene varios factores de riesgo, tal vez tenga que tomar medicamentos, incluso si las concentraciones son normales.  Esta informacin no tiene Marine scientist el consejo del mdico. Asegrese de hacerle al mdico cualquier pregunta que tenga. Document Released: 07/03/2005 Document Revised: 12/23/2016 Document Reviewed: 03/23/2016 Elsevier Interactive Patient Education  [employer].

## 2018-02-12 LAB — HEPATITIS PANEL, ACUTE
HEP C AB: NONREACTIVE
Hep A IgM: NONREACTIVE
Hep B C IgM: NONREACTIVE
Hepatitis B Surface Ag: NONREACTIVE
SIGNAL TO CUT-OFF: 0.02 (ref <1.00)

## 2018-02-12 NOTE — Progress Notes (Signed)
Left message return call

## 2018-02-12 NOTE — Progress Notes (Signed)
Lab work unremarkable - patient will be notified

## 2018-02-26 NOTE — Progress Notes (Signed)
Patient scheduled for mammogram 03/19/18 at 3:00pm. Patient notified and verbalized understanding.

## 2018-03-06 ENCOUNTER — Other Ambulatory Visit: Payer: Self-pay | Admitting: Family Medicine

## 2018-03-06 ENCOUNTER — Telehealth: Payer: Self-pay | Admitting: Family Medicine

## 2018-03-06 DIAGNOSIS — R945 Abnormal results of liver function studies: Principal | ICD-10-CM

## 2018-03-06 DIAGNOSIS — R7989 Other specified abnormal findings of blood chemistry: Secondary | ICD-10-CM

## 2018-03-06 NOTE — Progress Notes (Signed)
Pt needs follow up re elevated LFT's, repeat lab work and RUQ [place]  1. Elevated LFTs  - US Abdomen Limited RUQ; Future - Hepatic function panel; Future

## 2018-03-06 NOTE — Telephone Encounter (Signed)
Pt needs RUQ [place] and repeated labs for elevated LFT's, please call and notify.  Her other testing was negative.

## 2018-03-09 NOTE — Telephone Encounter (Signed)
Spoke with patient and informed her that her ultrasound is scheduled for 03/17/18 at 8:30 am at [address]. Nothing to eat or drink after midnight. Patient verbalized understanding.

## 2018-03-17 ENCOUNTER — Ambulatory Visit
Admission: RE | Admit: 2018-03-17 | Discharge: 2018-03-17 | Disposition: A | Discharge status: Home / Self Care | Payer: No Typology Code available for payment source | Source: Ambulatory Visit | Attending: Family Medicine | Admitting: Family Medicine

## 2018-03-17 DIAGNOSIS — R7989 Other specified abnormal findings of blood chemistry: Secondary | ICD-10-CM | POA: Diagnosis not present

## 2018-03-17 DIAGNOSIS — R945 Abnormal results of liver function studies: Principal | ICD-10-CM

## 2018-03-18 ENCOUNTER — Encounter: Payer: Self-pay | Admitting: Family Medicine

## 2018-03-18 DIAGNOSIS — K7581 Nonalcoholic steatohepatitis (NASH): Secondary | ICD-10-CM | POA: Insufficient documentation

## 2018-03-18 HISTORY — DX: Nonalcoholic steatohepatitis (NASH): K75.81

## 2018-03-18 NOTE — Progress Notes (Signed)
RUQ ultrasound shows likely fatty liver disease, this can cause worsening liver function.  We will repeat liver function tests in 3-6 months. Treatment is usually weight-loss, healthy diet and monitoring.  We will mail you information about the diagnosis.  Continue healthy diet, exercise.

## 2018-03-19 ENCOUNTER — Ambulatory Visit
Admission: RE | Admit: 2018-03-19 | Discharge: 2018-03-19 | Disposition: A | Discharge status: Home / Self Care | Payer: BLUE CROSS/BLUE SHIELD | Source: Ambulatory Visit | Attending: Family Medicine | Admitting: Family Medicine

## 2018-03-19 DIAGNOSIS — Z1231 Encounter for screening mammogram for malignant neoplasm of breast: Secondary | ICD-10-CM | POA: Diagnosis not present

## 2018-03-22 NOTE — Progress Notes (Signed)
Please notify pt of negative mammogram result, yearly screening.

## 2018-04-07 ENCOUNTER — Encounter: Payer: Self-pay | Admitting: Family Medicine

## 2018-04-07 ENCOUNTER — Ambulatory Visit (INDEPENDENT_AMBULATORY_CARE_PROVIDER_SITE_OTHER): Payer: BLUE CROSS/BLUE SHIELD | Admitting: Family Medicine

## 2018-04-07 ENCOUNTER — Other Ambulatory Visit: Payer: Self-pay

## 2018-04-07 VITALS — BP 124/82 | HR 88 | Temp 97.7°F | Resp 14 | Ht 62.5 in | Wt 187.8 lb

## 2018-04-07 DIAGNOSIS — K7581 Nonalcoholic steatohepatitis (NASH): Secondary | ICD-10-CM | POA: Diagnosis not present

## 2018-04-07 DIAGNOSIS — R7303 Prediabetes: Secondary | ICD-10-CM

## 2018-04-07 DIAGNOSIS — Z0000 Encounter for general adult medical examination without abnormal findings: Secondary | ICD-10-CM

## 2018-04-07 DIAGNOSIS — E782 Mixed hyperlipidemia: Secondary | ICD-10-CM

## 2018-04-07 DIAGNOSIS — J329 Chronic sinusitis, unspecified: Secondary | ICD-10-CM

## 2018-04-07 DIAGNOSIS — R945 Abnormal results of liver function studies: Secondary | ICD-10-CM | POA: Diagnosis not present

## 2018-04-07 DIAGNOSIS — N924 Excessive bleeding in the premenopausal period: Secondary | ICD-10-CM

## 2018-04-07 DIAGNOSIS — J310 Chronic rhinitis: Secondary | ICD-10-CM

## 2018-04-07 DIAGNOSIS — E669 Obesity, unspecified: Secondary | ICD-10-CM | POA: Diagnosis not present

## 2018-04-07 DIAGNOSIS — R7989 Other specified abnormal findings of blood chemistry: Secondary | ICD-10-CM

## 2018-04-07 DIAGNOSIS — E66811 Obesity, class 1: Secondary | ICD-10-CM

## 2018-04-07 LAB — CBC WITH DIFFERENTIAL/PLATELET
BASOS PCT: 0.8 %
Basophils Absolute: 50 cells/uL (ref 0–200)
Eosinophils Absolute: 143 cells/uL (ref 15–500)
Eosinophils Relative: 2.3 %
HEMATOCRIT: 38.9 % (ref 35.0–45.0)
HEMOGLOBIN: 12.7 g/dL (ref 11.7–15.5)
Lymphs Abs: 2623 cells/uL (ref 850–3900)
MCH: 30 pg (ref 27.0–33.0)
MCHC: 32.6 g/dL (ref 32.0–36.0)
MCV: 91.7 fL (ref 80.0–100.0)
MPV: 10.3 fL (ref 7.5–12.5)
Monocytes Relative: 8.7 %
Neutro Abs: 2846 cells/uL (ref 1500–7800)
Neutrophils Relative %: 45.9 %
Platelets: 414 10*3/uL — ABNORMAL HIGH (ref 140–400)
RBC: 4.24 10*6/uL (ref 3.80–5.10)
RDW: 12.9 % (ref 11.0–15.0)
Total Lymphocyte: 42.3 %
WBC: 6.2 10*3/uL (ref 3.8–10.8)
WBCMIX: 539 {cells}/uL (ref 200–950)

## 2018-04-07 MED ORDER — MONTELUKAST SODIUM 10 MG PO TABS: 10.0000 mg | ORAL_TABLET | Freq: Every day | ORAL | 0 refills | Status: DC

## 2018-04-07 MED ORDER — MULTI-VITAMIN/MINERALS PO TABS: 1.0000 | ORAL_TABLET | Freq: Every day | ORAL | 3 refills | Status: AC

## 2018-04-07 MED ORDER — CETIRIZINE HCL 10 MG PO TABS: 10.0000 mg | ORAL_TABLET | Freq: Every day | ORAL | 2 refills | Status: DC

## 2018-04-07 MED ORDER — MOMETASONE FUROATE 50 MCG/ACT NA SUSP: 2.0000 | Freq: Every day | NASAL | 0 refills | Status: DC

## 2018-04-07 NOTE — Progress Notes (Signed)
Patient ID: Latoya [name], female    DOB: 01-Feb-1971, 47 y.o.   MRN: [id number]  PCP: Delsa Grana, PA-C  Chief Complaint  Patient presents with  . folow up with lab work    Subjective:   Latoya [name] is a 47 y.o. female, presents to clinic with CC irregular heavy.  And nasal congestion.  Patient did have some abnormal labs including work-up for elevated LFTs which right upper quadrant ultrasound found fatty liver disease.  Patient was not here to discuss this today.  But she does note that she has not had any success losing any weight.  She does wish to have a dietary or weight loss referral.  Patient presents for 2 acute issues, history of present illness is obtained with in person Patent attorney.  Patient states that she is concerns of a extremely heavy and long menses that began on June 8 and lasted for nearly 9 days.  She states that for 5 days blood flow was extremely heavy she is constantly soaking her close and changing large heavy pads..  Did last for a total of 9 days and she had associated vaginal irritation and some itching.  Prior to this menses she has not had a period for roughly 6 or 7 months.  Vaginal irritation and vaginal bleeding did resolve she has not had any vaginal complaints for the past 2 months but she does have some mild decreased energy.  She denies any vaginal discharge, urinary symptoms including no dysuria, hematuria, urinary frequency or urgency.  She denies any pelvic pain, abdominal pain, back pain, nausea, vomiting, fever sweats or chills.    Patient also complains of sinus pain and pressure with nasal discharge and congestion for the past 10 days, has associated mild headaches, no fever, ear pain, sore throat, cough or shortness of breath.  No sick contacts.  She is not taking any medication for this.   Patient Active Problem List   Diagnosis Date Noted  . Steatohepatitis, nonalcoholic 60/63/0160  . Elevated LFTs 02/11/2018    . Obesity (BMI 30.0-34.9) 02/04/2018  . Hyperlipidemia 05/29/2016  . Prediabetes 05/29/2016     Prior to Admission medications   Medication Sig Start Date End Date Taking? Authorizing Provider  Multiple Vitamins-Minerals (MULTIVITAMIN WITH MINERALS) tablet Take 1 tablet by mouth daily. 02/04/18  Yes Delsa Grana, PA-C     No Known Allergies   Family History  Problem Relation Age of Onset  . Cancer Mother        uterine  . Diabetes Mother   . Hypertension Mother   . Diabetes Father   . Hypertension Father   . Cancer Father        stomach cancer ?  Marland Kitchen Heart disease Father   . Diabetes Sister      Social History   Socioeconomic History  . Marital status: Married    Spouse name: Not on file  . Number of children: Not on file  . Years of education: Not on file  . Highest education level: Not on file  Occupational History  . Not on file  Social Needs  . Financial resource strain: Not on file  . Food insecurity:    Worry: Not on file    Inability: Not on file  . Transportation needs:    Medical: Not on file    Non-medical: Not on file  Tobacco Use  . Smoking status: Never Smoker  . Smokeless tobacco: Never Used  Substance  and Sexual Activity  . Alcohol use: Yes    Comment: rarely  . Drug use: No  . Sexual activity: Yes    Partners: Male    Birth control/protection: Surgical    Comment: Monogamous with husband  Lifestyle  . Physical activity:    Days per week: 3 days    Minutes per session: 60 min  . Stress: Not at all  Relationships  . Social connections:    Talks on phone: Not on file    Gets together: Not on file    Attends religious service: Not on file    Active member of club or organization: Not on file    Attends meetings of clubs or organizations: Not on file    Relationship status: Not on file  . Intimate partner violence:    Fear of current or ex partner: Not on file    Emotionally abused: Not on file    Physically abused: Not on file     Forced sexual activity: Not on file  Other Topics Concern  . Not on file  Social History Narrative  . Not on file     Review of Systems  Constitutional: Negative.   HENT: Negative for ear discharge, ear pain, nosebleeds and sore throat.   Eyes: Negative.   Respiratory: Negative.   Cardiovascular: Negative.   Gastrointestinal: Negative.   Endocrine: Negative.   Genitourinary: Positive for menstrual problem. Negative for decreased urine volume, difficulty urinating, hematuria, pelvic pain and urgency.  Musculoskeletal: Negative.   Skin: Negative.  Negative for color change, pallor, rash and wound.  Allergic/Immunologic: Negative.   Neurological: Negative.   Hematological: Negative.   Psychiatric/Behavioral: Negative.   All other systems reviewed and are negative.      Objective:    Vitals:   04/07/18 0816  BP: 124/82  Pulse: 88  Resp: 14  Temp: 97.7 F (36.5 C)  TempSrc: Oral  SpO2: 97%  Weight: 187 lb 12.8 oz (85.2 kg)  Height: 5' 2.5" (1.588 m)      Physical Exam  Constitutional: She is oriented to person, place, and time. She appears well-developed and well-nourished.  Non-toxic appearance. No distress.  HENT:  Head: Normocephalic and atraumatic.  Right Ear: Tympanic membrane, external ear and ear canal normal.  Left Ear: Tympanic membrane, external ear and ear canal normal.  Nose: Mucosal edema and rhinorrhea present. Right sinus exhibits no maxillary sinus tenderness and no frontal sinus tenderness. Left sinus exhibits no maxillary sinus tenderness and no frontal sinus tenderness.  Mouth/Throat: Uvula is midline, oropharynx is clear and moist and mucous membranes are normal. No oropharyngeal exudate.  Eyes: Pupils are equal, round, and reactive to light. Conjunctivae, EOM and lids are normal. No scleral icterus.  No palpable conjunctival pallor  Neck: Normal range of motion and phonation normal. Neck supple. No tracheal deviation present.  Cardiovascular:  Normal rate, regular rhythm, normal heart sounds, intact distal pulses and normal pulses. Exam reveals no gallop and no friction rub.  No murmur heard. Pulses:      Radial pulses are 2+ on the right side, and 2+ on the left side.       Posterior tibial pulses are 2+ on the right side, and 2+ on the left side.  Pulmonary/Chest: Effort normal and breath sounds normal. No stridor. No respiratory distress. She has no wheezes. She has no rhonchi. She has no rales. She exhibits no tenderness.  Abdominal: Soft. Normal appearance and bowel sounds are normal. She exhibits no  distension and no mass. There is no tenderness. There is no rebound and no guarding.  Genitourinary:  Genitourinary Comments: Pt refuse  Musculoskeletal: Normal range of motion. She exhibits no edema or deformity.  Lymphadenopathy:    She has no cervical adenopathy.  Neurological: She is alert and oriented to person, place, and time. She exhibits normal muscle tone. Coordination and gait normal.  Skin: Skin is warm, dry and intact. Capillary refill takes less than 2 seconds. No rash noted. She is not diaphoretic. No cyanosis. No pallor. Nails show no clubbing.  Psychiatric: She has a normal mood and affect. Her speech is normal and behavior is normal.  Nursing note and vitals reviewed.         Assessment & Plan:      ICD-10-CM   1. Excessive bleeding in premenopausal period N92.4 US Pelvis Complete    CBC with Differential    US Transvaginal Non-OB   VB has stopped, no abd pain, pelvic pain, no vaginal sx, suspect normal heavy menses near onset of menopause.  Pt declines pelvic exam or testing because sx resolved.  Will do pelvic US.  Would like to r/o endometrial hyperplasia or polyp.    2. Rhinosinusitis J32.9 mometasone (NASONEX) 50 MCG/ACT nasal spray    montelukast (SINGULAIR) 10 MG tablet    cetirizine (ZYRTEC) 10 MG tablet   suspect allergic - f/up if not improving, f/up if worsening  3. Obesity (BMI 30.0-34.9)  E66.9 Referral to Nutrition and Diabetes Services   no improvement since establishing care  4. Elevated LFTs Need to recheck in ~ 2 months  R94.5 Referral to [facility] Diabetes Services  5. Steatohepatitis, nonalcoholic R91.63 Referral to [facility] Diabetes Services  6. Prediabetes R73.03 Referral to [facility] Diabetes Services  7. Mixed hyperlipidemia E78.2 Referral to [facility] and Diabetes Services       Delsa Grana, PA-C 04/07/18 8:45 AM

## 2018-04-07 NOTE — Patient Instructions (Addendum)
For heavy period - blood work and pelvic ultrasound  For sinuses - allergy medicine, call us if it gets worse, may need antibiotics  Follow up with [place] in 2 months with basic labs - will have nutrition and weight loss call you.    Sinusitis, en adultos Sinusitis, Adult La sinusitis es la inflamacin y Conservation officer, historic buildings en los senos paranasales. Los senos paranasales son espacios vacos en los huesos alrededor del rostro. Los senos paranasales se encuentran en estos lugares:  Alrededor de los ojos.  En la mitad de la frente.  Detrs de Mudlogger.  En los pmulos.  Los senos y las fosas nasales estn cubiertos de un lquido fibroso (mucosidad). Normalmente, la mucosidad drena a travs de los senos. Cuando los tejidos nasales se inflaman o hinchan, la mucosidad puede quedar atrapada o bloqueada, por lo que el aire no puede fluir por los senos paranasales. Esto fomenta la proliferacin de bacterias, virus y hongos, lo que produce infecciones. La sinusitis puede desarrollarse rpidamente y durar entre 7 y 10das (aguda) o ms de 12das (crnica). A menudo, esta afeccin surge despus de un resfriado. Cules son las causas? Esta afeccin es causada por cualquier sustancia que inflame los senos o evite que la mucosidad drene, por ejemplo:  Alergias.  Asma.  Infecciones virales o bacterianas.  Huesos con forma [employer] las fosas nasales.  Crecimientos nasales que contienen mucosidad (plipos nasales).  Aberturas sinusales estrechas.  Agentes contaminantes, como sustancias qumicas o irritantes presentes en el aire.  Un cuerpo extrao atorado [employer].  Infecciones por hongos. Esto es raro.  Qu incrementa el riesgo? Los siguientes factores pueden hacer que usted sea ms propenso a tener esta enfermedad:  Product manager o asma.  Haber tenido una infeccin reciente en las vas respiratorias superiores o un resfriado.  Tener deformidades estructurales u obstrucciones en la  nariz o los senos.  Tener un sistema inmunitario dbil.  Nadar o bucear mucho.  Abusar de los [employer].  Fumar.  Cules son los signos o los sntomas? Los principales sntomas de esta afeccin son dolor y sensacin de presin alrededor de los senos afectados. Otros sntomas pueden ser los siguientes:  Dolor en los dientes superiores.  Dolor de odos.  Dolor de [place].  Mal aliento.  Disminucin del sentido del olfato y del gusto.  Tos que empeora por la noche.  Fatiga.  Cristy Hilts.  Drenaje de mucosidad espesa que sale de la Lawyer. Generalmente, es de color verde y puede contener pus (purulento).  Nariz tapada o congestin nasal.  Goteo posnasal. Esto ocurre cuando se acumula mucosidad adicional en la garganta o la parte de atrs de la Lawyer.  Hinchazn y calor en los senos paranasales afectados.  Dolor de Investment banker, operational.  Sensibilidad a Naval architect.  Cmo se diagnostica? Esta enfermedad se diagnostica en funcin de los sntomas, los antecedentes mdicos y un examen fsico. Para descubrir si su afeccin es aguda o crnica, el mdico podra hacer lo siguiente:  Revisar su nariz en busca de plipos nasales.  Palpar los senos paranasales afectados para buscar signos de infeccin.  Observar la parte interna de los senos paranasales con un dispositivo que tiene una luz (endoscopio).  Si el mdico sospecha que usted padece sinusitis crnica, podra indicarle lo siguiente:  Pruebas de alergias.  Una muestra de mucosidad de la nariz (cultivo nasal) para buscar bacterias.  Examen de [place] de mucosidad, para ver si la sinusitis se relaciona con alguna alergia.  Si la  sinusitis no responde al tratamiento y dura ms de 8semanas, se le podra pedir una resonancia magntica (RM) o una exploracin por tomografa computarizada (TC) para examinar los senos paranasales. Estos estudios tambin ayudan a Office manager gravedad de la infeccin. En contadas ocasiones, se puede  ordenar una biopsia de hueso para descartar tipos ms graves de infecciones por hongos en los senos paranasales. Cmo se trata? El tratamiento para la sinusitis depende de la causa y de si la afeccin es [place]. Si lo que causa la sinusitis es un virus, los sntomas desparecern por s solos en el trmino de 10das. Podran recetarle medicamentos para [employer], entre los que se incluyen los siguientes:  Descongestivos nasales tpicos. [place] y permiten que la mucosidad drene por los senos paranasales.  Antihistamnicos. Este tipo de medicamento bloquea la inflamacin que ocasionan las [place]. Pueden ayudar a reducir la inflamacin en la nariz y los senos.  Corticoesteroides nasales tpicos. Son aerosoles nasales que reducen la inflamacin e hinchazn en la Doran Durand y los senos.  Lavados nasales con solucin salina. Estos enjuagues pueden ayudar a eliminar la mucosidad espesa en la nariz.  Si la afeccin es causada por una bacteria, se le recetarn antibiticos. Si es causada por un hongo, se le recetarn antimicticos. Se podra necesitar ciruga para tratar enfermedades preexistentes, como las fosas nasales estrechas. Tambin podra ser necesaria para eliminar plipos. Siga estas indicaciones en su casa: [employer], use o aplquese los medicamentos de venta libre y Editor, commissioning como se lo haya indicado el mdico. Estos pueden incluir aerosoles nasales.  Si le recetaron un antibitico, tmelo como se lo haya indicado el mdico. No deje de tomar el antibitico aunque comience a sentirse mejor. Hidrtese y humidifique los ambientes  Beba suficiente agua para Theatre manager la orina clara o de color amarillo plido. Mantenerse hidratado lo ayudar a [employer].  Use un humidificador de vapor fro para mantener la humedad de su hogar por encima del 50%.  Realice inhalaciones de vapor por 10 a 45mnutos, de 3 a 4veces al da o tal  como se lo haya indicado el mdico. Puede hacer esto en el bao con el vapor del agua caliente de la ducha.  Limite la exposicin al aire fro o seco. Reposo  Descanse todo lo que pueda.  Duerma con la cabeza levantada (elevada).  Asegrese de dormir lo suficiente cada noche. Instrucciones generales  Aplquese un pao tibio y hmedo en la cara 3 o 4veces al da o como se lo haya indicado el mdico. Esto ayuda a cActorlas [place]  Lvese las manos frecuentemente con agua y jabn para reducir la exposicin a virus y otras bacterias. Use desinfectante para manos si no dispone de a[place] Republicy jReunion  No fume. Evite estar cerca de personas que fuman (fumador pasivo).  Concurra a todas las visitas de seguimiento como se lo haya indicado el mdico. Esto es importante. Comunquese con un mdico si:  Tiene fiebre.  Los sntomas empeoran.  Los sntomas no mejoran en el trmino de 10das. Solicite ayuda de inmediato si:  Tiene un dolor de cabeza intenso.  Tiene vmitos persistentes.  Tiene dolor o hinchazn en la zona del rostro o los ojos.  Tiene problemas de visin.  Se siente confundido.  Tiene el cuello rgido.  Tiene dificultad para respirar. Esta informacin no tiene cMarine scientistel consejo del mdico. Asegrese de hacerle al mdico cualquier pregunta que tenga. Document Released: 07/03/2005 Document  Revised: 02/04/2017 Document Reviewed: 07/19/2015 Elsevier Interactive Patient Education  2018 [place] (Perimenopause) La perimenopausia es el momento en que su cuerpo comienza a pasar a la menopausia (sin menstruacin durante 12 meses consecutivos). Es un proceso natural. La perimenopausia puede comenzar entre 2 y 21 aos antes de la menopausia y por lo general tiene una duracin de 1 ao ms pasada la menopausia. [employer], los ovarios podran producir un vulo o no. Los ovarios varan su produccin de las hormonas estrgeno y  Technical brewer. Esto puede causar perodos menstruales irregulares, dificultad para quedar embarazada, hemorragia vaginal entre perodos y sntomas incmodos. CAUSAS  Produccin irregular de las hormonas ovricas estrgeno y Immunologist, y no ovular todos los meses.  Otras causas son: ? Tumor de la glndula pituitaria. ? Enfermedades que [employer] ovarios. ? Radioterapia. ? Quimioterapia. ? Causas desconocidas. ? Fumar mucho y abusar del consumo de alcohol puede llevar a que la perimenopausia aparezca antes.  SIGNOS Y SNTOMAS  Acaloramiento.  Sudoracin nocturna.  Perodos menstruales irregulares.  Disminucin del deseo sexual.  Sequedad vaginal.  Dolores de cabeza.  Cambios en el estado de nimo.  Depresin.  Problemas de memoria.  Irritabilidad.  Cansancio.  Aumento de [place].  Problemas para quedar embarazada.  Prdida de clulas seas (osteoporosis).  Comienzo de endurecimiento de las arterias (aterosclerosis).  DIAGNSTICO El mdico realizar un diagnstico en funcin de su edad, historial de perodos menstruales y sntomas. Le realizarn un examen fsico para ver si hay algn cambio en su cuerpo, en especial en sus rganos reproductores. Las pruebas hormonales pueden ser o no tiles segn la cantidad de hormonas femeninas que produzca y [employer] produzca. Sin embargo, podrn Microbiologist pruebas hormonales para Statistician. TRATAMIENTO En algunos casos, no se necesita tratamiento. La decisin acerca de qu tratamiento es necesario durante la perimenopausia deber realizarse en conjunto con su mdico segn cmo estn afectando los sntomas a su estilo de vida. Existen varios tratamientos disponibles, como:  Risk manager cada sntoma individual con medicamentos especficos para ese sntoma.  Algunos medicamentos herbales pueden ayudar en sntomas especficos.  Psicoterapia.  Terapia grupal. INSTRUCCIONES PARA EL CUIDADO EN EL  HOGAR  Controle sus periodos menstruales (cundo ocurren, qu tan abundantes son, cunto tiempo pasa entre perodos, y cunto duran) como tambin sus sntomas y cundo comenzaron.  Tome slo medicamentos de venta libre o recetados, segn las indicaciones del mdico.  Duerma y descanse.  Haga actividad fsica.  Consuma una dieta que contenga calcio (bueno para los [place]) y productos derivados de la soja (actan como estrgenos).  No fume.  Evite las bebidas alcohlicas.  Tome los suplementos vitamnicos segn las indicaciones del mdico. En ciertos casos, puede ser de [place] tomar vitamina E.  Tome suplementos de calcio y vitamina D para ayudar a Publishing rights manager prdida sea.  En algunos casos la terapia de grupo podr ayudarla.  La acupuntura puede ser de ayuda en ciertos casos.  SOLICITE ATENCIN MDICA SI:  Tiene preguntas acerca de sus sntomas.  Necesita ser derivada a un especialista (gineclogo, psiquiatra, o psiclogo).  SOLICITE ATENCIN MDICA DE INMEDIATO SI:  Sufre una hemorragia vaginal abundante.  Su perodo menstrual dura ms de 8 das.  Sus perodos son recurrentes cada menos de [address]  Tiene hemorragias durante las [employer].  Est muy deprimido.  Siente dolor al [employer].  Siente dolor de cabeza intenso.  Tiene problemas de visin.  Esta informacin no tiene Marine scientist el consejo  del mdico. Asegrese de hacerle al mdico cualquier pregunta que tenga. Document Released: 09/23/2005 Document Revised: 07/14/2013 Document Reviewed: 04/22/2013 Elsevier Interactive Patient Education  2017 [employer] Menorrhagia La menorragia es una afeccin por la cual los perodos menstruales son muy abundantes o duran ms de lo normal. La mayora de los perodos de una mujer con menorragia pueden causar una prdida de sangre abundante y clicos abdominales que le impidan realizar sus actividades habituales. Cules son las  causas? Las causas ms frecuentes de esta afeccin incluyen las siguientes:  Formaciones no cancerosas en el tero (plipos o fibromas).  Un desequilibrio entre las hormonas estrgeno y Immunologist.  Uno de los ovarios no libera vulos durante uno o ms meses.  Un problema con la glndula tiroidea (hipotiroidismo).  Efectos secundarios por haberse colocado un dispositivo intrauterino (DIU).  Efectos secundarios por algunos medicamentos, como antiinflamatorios o anticoagulantes.  Un trastorno hemorrgico que impide la Transport planner.  En algunos casos, se desconoce la causa de este trastorno. Cules son los signos o los sntomas? Los sntomas de esta afeccin incluyen lo siguiente:  Tiene que cambiar el apsito o el tampn cada 1 o 2 horas debido a que est completamente empapado.  Necesita usar apsitos y tampones al mismo tiempo porque pierde [employer].  Debe levantarse para cambiarse el apsito o el tampn durante la noche.  Elimina cogulos ms grandes de 1 pulgada (2,5 cm).  El sangrado dura ms de 7 das.  Tiene sntomas de niveles bajos de hierro (anemia), como cansancio, fatiga o falta de aire.  Cmo se diagnostica? Esta afeccin se puede diagnosticar en funcin de lo siguiente:  Un examen fsico.  Sus sntomas y antecedentes menstruales.  Estudios, por ejemplo: ? Anlisis de sangre para verificar si est embarazada o tiene cambios hormonales, trastornos de la tiroides o hemorrgicos, anemia, u otros problemas. ? Prueba de Papanicolaou para verificar cambios malignos, infecciones o inflamacin. ? Biopsia de endometrio. Esta prueba implica retirar Truddie Coco de tejido de la pared del tero (endometrio) para examinarlo con microscopio. ? Ecografa plvica. Este estudio [place] ondas de sonido para tomar imgenes del tero, los ovarios y Geneticist, molecular. Las imgenes pueden mostrar si tiene fibromas u otros crecimientos. ? Histeroscopia. Para este estudio  se [place] un pequeo telescopio para ver dentro de su tero.  Cmo se trata? Es posible que no se requiera tratamiento para esta afeccin. En caso de ser necesario, el mejor tratamiento para usted depender de lo siguiente:  Si necesita Environmental health practitioner.  Si desea tener hijos en el futuro.  La causa y la gravedad del sangrado.  Su preferencia personal.  Los medicamentos son Software engineer. Puede recibir [employer] siguientes tratamientos:  Mtodos anticonceptivos hormonales. Estos tratamientos reducen el sangrado durante el perodo menstrual. Estos incluyen los siguientes: ? Anticonceptivos orales. ? Parche drmico. ? Anillo vaginal. ? Inyecciones que recibe cada 3 meses. ? DIU hormonal (dispositivo intrauterino). ? Implantes que se colocan debajo de la piel.  Medicamentos que espesan la sangre y hacen ms lento el sangrado.  Medicamentos que reducen la inflamacin, como el ibuprofeno.  Medicamentos que contienen una hormona artificial (sinttica) llamada progestina.  Medicamentos que [employer] ovarios dejen de funcionar durante un breve lapso.  Suplementos de hierro para tratar la anemia.  Si los medicamentos no [facility], puede ser [place]. Algunas opciones quirrgicas son las siguientes:  Dilatacin y curetaje (D y C). En este procedimiento, su  mdico abre (dilata) el cuello uterino y luego raspa o succiona tejido del endometrio para disminuir el sangrado menstrual.  Histeroscopia quirrgica. En este procedimiento, se utiliza un pequeo tubo con una luz en el extremo (histeroscopio) para observar el tero y ayudar en la extirpacin de un plipo que puede ser la causa de perodos abundantes.  Ablacin del endometrio. Se usan varias tcnicas para destruir permanentemente todo el endometrio. Luego de la ablacin del endometrio, la mayora de las mujeres tienen escaso flujo menstrual, o no lo tienen. Este procedimiento reduce la  posibilidad de quedar embarazada.  Reseccin del endometrio. En este procedimiento, se [place] un asa de alambre electroquirrgica para extirpar el endometrio. Este procedimiento reduce la posibilidad de quedar embarazada.  Histerectoma. Es la remocin [place] del tero. Es un procedimiento permanente que interrumpe los perodos [place]. No es posible quedar embarazada luego de una histerectoma.  Siga estas instrucciones en su casa: Medicamentos  [employer] recetados y de venta libre exactamente como se lo haya indicado su mdico. Esto incluye pldoras de suplemento de hierro.  No cambie ni reemplace los medicamentos sin consultarlo con su mdico.  No tome aspirina ni medicamentos que contengan aspirina desde 1 semana antes ni durante el perodo menstrual. La aspirina puede hacer que la hemorragia empeore. Instrucciones generales  Si necesita cambiar el apsito o el tampn ms de una vez cada 2horas, limite su actividad hasta que la hemorragia se detenga.  Las pldoras de suplemento de hierro pueden Recruitment consultant. A fin de prevenir o tratar el estreimiento mientras toma suplementos de hierro recetados, el mdico puede recomendarle lo siguiente: ? Electronics engineer suficiente lquido para mantener la orina clara o de color amarillo plido. ? Tomar medicamentos recetados o de [employer]. ? Consumir alimentos ricos en fibra, como frutas y verduras frescas, cereales integrales y frijoles. ? Limitar el consumo de alimentos con alto contenido de grasas y azcares procesados, como alimentos fritos o dulces.  Seguir una dieta balanceada, lo que incluye alimentos con alto contenido de hierro. Entre estos alimentos se incluyen vegetales de [place], carne, [place], [place] y panes y Psychologist, prison and probation services.  No trate de perder peso hasta que la hemorragia anormal se detenga y los niveles de hierro en la sangre vuelvan a la normalidad. Si debe perder peso, hable con su mdico para hacerlo de  manera segura.  Concurra a todas las visitas de control como se lo haya indicado el mdico. Esto es importante. Comunquese con un mdico si:  Empapa un tampn o un apsito cada 1 o 2 horas, y [employer] ocurre cada vez que tiene el perodo.  Necesita usar apsitos y tampones al mismo tiempo porque pierde [employer].  Tiene nuseas, vmitos, diarrea u otros problemas relacionados con los medicamentos que est tomando. Solicite ayuda de inmediato si:  Empapa ms de un apsito o tampn en 1 hora.  Elimina cogulos ms grandes que 1 pulgada (2,5 cm).  Le falta el aire.  Siente que el corazn late [place] rpido.  Se siente mareada o se desmaya.  Se siente muy dbil o cansada. Resumen  La menorragia es una afeccin por la cual los perodos menstruales son muy abundantes o duran ms de lo normal.  El tratamiento depender de la causa de la afeccin y puede incluir medicamentos o procedimientos.  Tome los [employer] recetados y de venta libre exactamente como se lo haya indicado su mdico. Esto incluye pldoras de suplemento de hierro.  Busque ayuda de inmediato si tiene sangrado abundante  y empapa ms de un apsito o tampn en 1 hora, si despide cogulos grandes o si se siente mareada, se desmaya o le falta de aire. Esta informacin no tiene Marine scientist el consejo del mdico. Asegrese de hacerle al mdico cualquier pregunta que tenga. Document Released: 07/03/2005 Document Revised: 01/10/2017 Document Reviewed: 01/10/2017 Elsevier Interactive Patient Education  2018 [place] heptica por alcoholismo (Alcoholic Liver Disease) La enfermedad heptica por alcoholismo ocurre cuando el hgado no funciona correctamente. La causa de esta enfermedad es el consumo excesivo de alcohol durante muchos aos. CUIDADOS EN EL HOGAR  No beba alcohol.  Tome los medicamentos solamente como se lo haya indicado el [place] vitaminas solamente como se lo  haya indicado el mdico.  Siga las indicaciones que el mdico le dio respecto de la dieta. Puede ser necesario que: ? Consuma alimentos que contengan tiamina, entre ellos, cereales [place], cerdo y verduras crudas. ? Consuma alimentos con cido flico, entre ellos, verduras, frutas, carnes, frijoles, frutos secos y productos lcteos. ? Consuma alimentos con alto contenido de carbohidratos, entre ellos, yogur, frijoles, patatas y Occupational psychologist. SOLICITE AYUDA SI:  Tiene fiebre.  Comienza a sentir falta de [place].  Tiene dificultad para respirar.  Observa sangre de color rojo brillante en las heces (materia fecal).  Las heces parecen alquitranadas.  Vomita sangre.  La piel se le torna de un color ms amarillento, plida u oscura.  Tiene dolores de [place].  Tiene dificultad para pensar.  Tiene problemas de equilibrio o para caminar. Esta informacin no tiene Marine scientist el consejo del mdico. Asegrese de hacerle al mdico cualquier pregunta que tenga. Document Released: 10/26/2010 Document Revised: 02/07/2015 Document Reviewed: 08/25/2014 Elsevier Interactive Patient Education  [employer].

## 2018-04-08 NOTE — Progress Notes (Signed)
Pts blood levels are good, no anemia

## 2018-04-16 ENCOUNTER — Ambulatory Visit
Admission: RE | Admit: 2018-04-16 | Discharge: 2018-04-16 | Disposition: A | Discharge status: Home / Self Care | Payer: BLUE CROSS/BLUE SHIELD | Source: Ambulatory Visit | Attending: Family Medicine | Admitting: Family Medicine

## 2018-04-16 DIAGNOSIS — D251 Intramural leiomyoma of uterus: Secondary | ICD-10-CM | POA: Diagnosis not present

## 2018-04-16 NOTE — Progress Notes (Signed)
Pt wants to be called between 4:10 and 5 pm, so if we are able to.   Ultrasound of uterus showed normal thickness of lining inside uterus.  She has a small fibroid in uterine wall, this is not concerning. If she has any heavy bleeding again we will need her to go to [facility].  If she does not then we don't have to do anything else.

## 2018-05-21 ENCOUNTER — Encounter: Payer: BLUE CROSS/BLUE SHIELD | Attending: Family Medicine | Admitting: Registered"

## 2018-05-21 ENCOUNTER — Encounter: Payer: Self-pay | Admitting: Registered"

## 2018-05-21 DIAGNOSIS — K7581 Nonalcoholic steatohepatitis (NASH): Secondary | ICD-10-CM | POA: Diagnosis not present

## 2018-05-21 DIAGNOSIS — E669 Obesity, unspecified: Secondary | ICD-10-CM | POA: Diagnosis not present

## 2018-05-21 DIAGNOSIS — R945 Abnormal results of liver function studies: Secondary | ICD-10-CM | POA: Diagnosis not present

## 2018-05-21 DIAGNOSIS — R7303 Prediabetes: Secondary | ICD-10-CM | POA: Diagnosis not present

## 2018-05-21 DIAGNOSIS — Z713 Dietary counseling and surveillance: Secondary | ICD-10-CM | POA: Insufficient documentation

## 2018-05-21 DIAGNOSIS — E782 Mixed hyperlipidemia: Secondary | ICD-10-CM | POA: Diagnosis not present

## 2018-05-21 NOTE — Progress Notes (Signed)
Medical Nutrition Therapy:  Appt start time: 7858 end time:  8502.   Assessment:  Primary concerns today: Pt referred for weight management, prediabetes, mixed hyperlipidemia, steatohepatitis, and elevated LFTs. [place] in-person interpreter assisted with communication for this appointment. Pt reports she would like help with losing weight. She reports that she did not realize/remember that her blood sugar was within the prediabetes range and she does not want to get diabetes.   Pt reports that she wakes up at 4 am and will exercise for 1.5 hours (5 days per week) or will walk at work during her break on days she is unable to go to the gym. Around 6:30 am pt reports that she eats a green smoothie (1 bottle of water, parsley, cactus, celery, garlic, lemon, ginger, tumeric) and then goes to work at 7 am. Pt reports that her first break is at 11 am and at that time pt will eat an oatmeal and fruit smoothie (oatmeal, banana, apple, papaya). Dinner is usually around 5 pm and pt usually prepares something at home. On the weekends, pt reports that breakfast is often 2 eggs, beans, tortilla, coffee and may have peanut butter on toast. Pt reports that a typical lunch includes fish with mayonnaise, rice, salad or leftovers. Pt gets home from work around 4 PM. Pt goes to [place] classes at 6 pm. Pt usually goes to bed around 10 PM and may drink some coffee before bed.   Noted Lab Values: 02/04/18:  HgbA1c: 5.9   Preferred Learning Style:  No preference indicated   Learning Readiness:   Ready  MEDICATIONS: See list.    DIETARY INTAKE:  Usual eating pattern includes 3 meals and 0 snacks per day.  Everyday foods include none reported.  Avoided foods include none reported.     24-hr recall:  (6 AM): Green drink (1 bottle of water, parsley, cactus, celery, garlic, lemon, ginger, tumeric)  (~11 AM): corn 2 tacos with beef, chili, beans, water    (2 PM): oatmeal with fruit smoothie D (5 PM): baked  chicken, macaroni,  Snk ( PM): coffee with whole milk and ~2 tsp of sugar, creamer  Beverages: Drinks 6-10 bottles of water per day  Usual physical activity: Works out usually 5 days per week for 1.5 hours: stretching 45 minutes; boxing for 45 minutes; may do 15 minutes of stationary bicycle. If unable to workout pt will walk during her break at work.   Progress Towards Goal(s):  In progress.   Nutritional Diagnosis:  NB-1.1 Food and nutrition-related knowledge deficit As related to balanced nutriton .  As evidenced by pt has questions regarding what foods she should eat to promote health/weight loss.    Intervention:  Nutrition counseling provided. Dietitian provided education regarding balanced nutrition and how to eat to promote blood sugar management. Discussed importance of focusing on habits that promote overall health rather than focusing on weight. Dietitian encouraged pt to eat breakfast within 1-2 hours of waking in the morning to get metabolism going and to provide more energy to start her day as pt is currently eating a very low calorie homemade drink. Discussed adding more protein to breakfast and alternative breakfasts to add variety. Discussed benefits of continuing to include physical activity. Pt appeared agreeable to information/goals discussed.   Instructions/Goals:  Make sure to get in three meals per day. Try to have balanced meals like the My Plate example (see handout). Try to include lean proteins, vegetables, fruits, and whole grains at meals.  Recommend having something to eat within 1-2 hours of when you get up. Could include the oatmeal and fruit smoothie with added protein earlier in the morning before work.   Breakfast: Want to include protein and carbohydrates-ideas: eggs, whole wheat toast and a piece of fruit; oatmeal/fruit smoothie with some added protein (boiled eggs on the side or can add fat free [place] yogurt or peanut butter to the smoothie for protein) OR  peanut butter sandwich and some fruit and/or milk; OR cottage cheese, fruit, some whole wheat toast, etc (see handout)   Lunch and Dinner-see handouts for examples of balanced meals.   Continue staying well hydrated with including at least 4 bottles of water (at least 64 oz) each day.   Make physical activity a part of your week. Try to include at least 30 minutes of physical activity 5 days each week or at least 150 minutes per week. Regular physical activity promotes overall health-including helping to reduce risk for heart disease and diabetes, promoting mental health, and helping [place] sleep better.    Teaching Method Utilized:  Visual Auditory  Handouts given during visit include:  Balanced plate (English and Spanish per pt's request) food list (English)   Low Fat Cooking Methods (English and Spanish)   Barriers to learning/adherence to lifestyle change: None indicated.   Demonstrated degree of understanding via:  Teach Back   Monitoring/Evaluation:  Dietary intake, exercise, and body weight in 1 month(s).

## 2018-05-21 NOTE — Patient Instructions (Addendum)
Instructions/Goals:  Make sure to get in three meals per day. Try to have balanced meals like the My Plate example (see handout). Try to include lean proteins, vegetables, fruits, and whole grains at meals.   Recommend having something to eat within 1-2 hours of when you get up. Could include the oatmeal and fruit smoothie with added protein earlier in the morning before work.   Breakfast: Want to include protein and carbohydrates-ideas: eggs, whole wheat toast and a piece of fruit; oatmeal/fruit smoothie with some added protein (boiled eggs on the side or can add fat free [place] yogurt or peanut butter to the smoothie for protein) OR peanut butter sandwich and some fruit and/or milk; OR cottage cheese, fruit, some whole wheat toast, etc (see handout)   Lunch and Dinner-see handouts for examples of balanced meals.   Continue staying well hydrated with including at least 4 bottles of water (at least 64 oz) each day.   Make physical activity a part of your week. Try to include at least 30 minutes of physical activity 5 days each week or at least 150 minutes per week. Regular physical activity promotes overall health-including helping to reduce risk for heart disease and diabetes, promoting mental health, and helping [place] sleep better.

## 2018-06-09 ENCOUNTER — Ambulatory Visit: Payer: BLUE CROSS/BLUE SHIELD | Admitting: Family Medicine

## 2018-06-11 ENCOUNTER — Ambulatory Visit (INDEPENDENT_AMBULATORY_CARE_PROVIDER_SITE_OTHER): Payer: BLUE CROSS/BLUE SHIELD | Admitting: Family Medicine

## 2018-06-11 ENCOUNTER — Encounter: Payer: Self-pay | Admitting: Family Medicine

## 2018-06-11 ENCOUNTER — Other Ambulatory Visit: Payer: Self-pay

## 2018-06-11 VITALS — BP 126/68 | HR 76 | Temp 98.6°F | Resp 14 | Ht 62.5 in | Wt 192.0 lb

## 2018-06-11 DIAGNOSIS — K7581 Nonalcoholic steatohepatitis (NASH): Secondary | ICD-10-CM | POA: Diagnosis not present

## 2018-06-11 DIAGNOSIS — R7303 Prediabetes: Secondary | ICD-10-CM

## 2018-06-11 DIAGNOSIS — E669 Obesity, unspecified: Secondary | ICD-10-CM

## 2018-06-11 DIAGNOSIS — E782 Mixed hyperlipidemia: Secondary | ICD-10-CM | POA: Diagnosis not present

## 2018-06-11 NOTE — Progress Notes (Signed)
Patient ID: Latoya [name], female    DOB: December 01, 1970, 47 y.o.   MRN: [id number]  PCP: Delsa Grana, PA-C  Chief Complaint  Patient presents with  . Follow-up    is not fasting    Subjective:   Latoya [name] is a 47 y.o. female, presents to clinic with CC of fatty liver disease with elevated LFT's is due to recheck labs after working on diet, exercise, weightloss for the past 4 months.  She states that she has not done any lifestyle changes and only recently went to nutritional therapy about 3 weeks ago.  She did not do repeated labs and she is not fasting today.   She has had no weight loss.  She has no complaints, no pain. She was previously seen for excessive bleeding in perimenopause, ultrasound results were called to her home to her daughter per her request, however she states she never got results.  Results were reviewed with her.  She has not had any menses since 04/07/18, she was having intermittent hot flashes and she is no longer having those either.     Patient Active Problem List   Diagnosis Date Noted  . Steatohepatitis, nonalcoholic 18/84/1660  . Elevated LFTs 02/11/2018  . Obesity (BMI 30.0-34.9) 02/04/2018  . Hyperlipidemia 05/29/2016  . Prediabetes 05/29/2016     Prior to Admission medications   Medication Sig Start Date End Date Taking? Authorizing Provider  Multiple Vitamins-Minerals (MULTIVITAMIN WITH MINERALS) tablet Take 1 tablet by mouth daily. 04/07/18  Yes Delsa Grana, PA-C     No Known Allergies   Family History  Problem Relation Age of Onset  . Cancer Mother        uterine  . Diabetes Mother   . Hypertension Mother   . Diabetes Father   . Hypertension Father   . Cancer Father        stomach cancer ?  Marland Kitchen Heart disease Father   . Diabetes Sister      Social History   Socioeconomic History  . Marital status: Married    Spouse name: Not on file  . Number of children: Not on file  . Years of education: Not on file  .  Highest education level: Not on file  Occupational History  . Not on file  Social Needs  . Financial resource strain: Not on file  . Food insecurity:    Worry: Not on file    Inability: Not on file  . Transportation needs:    Medical: Not on file    Non-medical: Not on file  Tobacco Use  . Smoking status: Never Smoker  . Smokeless tobacco: Never Used  Substance and Sexual Activity  . Alcohol use: Yes    Comment: rarely  . Drug use: No  . Sexual activity: Yes    Partners: Male    Birth control/protection: Surgical    Comment: Monogamous with husband  Lifestyle  . Physical activity:    Days per week: 3 days    Minutes per session: 60 min  . Stress: Not at all  Relationships  . Social connections:    Talks on phone: Not on file    Gets together: Not on file    Attends religious service: Not on file    Active member of club or organization: Not on file    Attends meetings of clubs or organizations: Not on file    Relationship status: Not on file  . Intimate partner violence:  Fear of current or ex partner: Not on file    Emotionally abused: Not on file    Physically abused: Not on file    Forced sexual activity: Not on file  Other Topics Concern  . Not on file  Social History Narrative  . Not on file     Review of Systems  Constitutional: Negative.  Negative for unexpected weight change.  HENT: Negative.   Eyes: Negative.   Respiratory: Negative.   Cardiovascular: Negative.   Gastrointestinal: Negative.  Negative for abdominal pain.  Endocrine: Negative.   Genitourinary: Negative.  Negative for vaginal bleeding.  Musculoskeletal: Negative.   Allergic/Immunologic: Negative.   Neurological: Negative.   Hematological: Negative.   Psychiatric/Behavioral: Negative.   All other systems reviewed and are negative.      Objective:    Vitals:   06/11/18 1611  BP: 126/68  Pulse: 76  Resp: 14  Temp: 98.6 F (37 C)  TempSrc: Oral  SpO2: 99%  Weight: 192  lb (87.1 kg)  Height: 5' 2.5" (1.588 m)      Physical Exam  Constitutional: She appears well-developed and well-nourished. No distress.  HENT:  Head: Normocephalic and atraumatic.  Nose: Nose normal.  Eyes: Conjunctivae are normal. Right eye exhibits no discharge. Left eye exhibits no discharge.  Neck: No tracheal deviation present.  Cardiovascular: Normal rate and regular rhythm.  Pulmonary/Chest: Effort normal. No stridor. No respiratory distress.  Musculoskeletal: Normal range of motion.  Neurological: She is alert. She exhibits normal muscle tone. Coordination normal.  Skin: Skin is warm and dry. No rash noted. She is not diaphoretic.  Psychiatric: She has a normal mood and affect. Her behavior is normal.  Nursing note and vitals reviewed.         Assessment & Plan:      ICD-10-CM   1. Obesity (BMI 30.0-34.9) E66.9   2. Steatohepatitis, nonalcoholic P79.43   3. Mixed hyperlipidemia E78.2   4. Prediabetes R73.03   pt here for recheck of elevated LFT's, fatty liver disease, mixed hyperlipidemia, prediabetes and obesity dx 02/04/18 when establishing care here.  She was going to work on diet and exercise with reduced calories she also was referred to nutritional therapy, repeated labs 4 months later were ordered in conjunction with his follow-up appointment.  Patient has not obtained repeated labs, she only recently went to see the dietitian, about 3 weeks ago, she wishes to wait another month or 2 before repeating her lab work.  She has no other complaints at this time, no other symptoms.  Her vital signs are within normal limits but of note she has gained weight in the interim.  Discussed with a Spanish translator again the importance of diet and exercise to decrease cholesterol, sugar/prediabetes, and to improve fatty liver disease which can damage her liver.  Future labs are still available and reiterated to patient through the translator that she can come in the next 1 to 2  months to repeat them, can come in in the morning fasting or can call make an appointment with the lab technician.  She verbalizes understanding and states she will come in 1 month.  Will determine need for f/up appt depending on lab results.    Delsa Grana, PA-C 06/11/18 4:15 PM

## 2019-03-30 ENCOUNTER — Other Ambulatory Visit: Payer: Self-pay | Admitting: Family Medicine

## 2019-03-30 DIAGNOSIS — Z1231 Encounter for screening mammogram for malignant neoplasm of breast: Secondary | ICD-10-CM

## 2019-04-02 ENCOUNTER — Encounter: Payer: Self-pay | Admitting: Family Medicine

## 2019-04-02 ENCOUNTER — Encounter: Payer: BLUE CROSS/BLUE SHIELD | Admitting: Family Medicine

## 2019-04-15 ENCOUNTER — Other Ambulatory Visit: Payer: Self-pay

## 2019-04-15 ENCOUNTER — Encounter: Payer: Self-pay | Admitting: Family Medicine

## 2019-04-15 ENCOUNTER — Ambulatory Visit (INDEPENDENT_AMBULATORY_CARE_PROVIDER_SITE_OTHER): Payer: BC Managed Care – PPO | Admitting: Family Medicine

## 2019-04-15 VITALS — BP 134/76 | HR 85 | Temp 99.1°F | Resp 18 | Wt 199.4 lb

## 2019-04-15 DIAGNOSIS — Z0001 Encounter for general adult medical examination with abnormal findings: Secondary | ICD-10-CM

## 2019-04-15 DIAGNOSIS — Z1329 Encounter for screening for other suspected endocrine disorder: Secondary | ICD-10-CM

## 2019-04-15 DIAGNOSIS — Z6835 Body mass index (BMI) 35.0-35.9, adult: Secondary | ICD-10-CM

## 2019-04-15 DIAGNOSIS — E782 Mixed hyperlipidemia: Secondary | ICD-10-CM

## 2019-04-15 DIAGNOSIS — R7303 Prediabetes: Secondary | ICD-10-CM

## 2019-04-15 DIAGNOSIS — Z130 Encounter for screening for diseases of the blood and blood-forming organs and certain disorders involving the immune mechanism: Secondary | ICD-10-CM

## 2019-04-15 DIAGNOSIS — Z13228 Encounter for screening for other metabolic disorders: Secondary | ICD-10-CM

## 2019-04-15 DIAGNOSIS — Z1211 Encounter for screening for malignant neoplasm of colon: Secondary | ICD-10-CM

## 2019-04-15 NOTE — Patient Instructions (Addendum)
Return in Oct for flu shot - can do a nurse visit and come in and get done  [place] Maintenance, Female Adoptar un estilo de vida saludable y recibir atencin preventiva son importantes para promover la salud y Musician. Consulte al mdico sobre:  El esquema adecuado para hacerse pruebas y exmenes peridicos.  Cosas que puede hacer por su cuenta para prevenir enfermedades y [employer]. Qu debo saber sobre la dieta, el peso y el ejercicio? Consuma una dieta saludable   Consuma una dieta que incluya muchas verduras, frutas, productos lcteos con bajo contenido de [place] y Advertising account planner.  No consuma muchos alimentos ricos en grasas slidas, azcares agregados o sodio. Mantenga un peso saludable El ndice de masa muscular [facility]) se [place] para identificar problemas de [employer]. Proporciona una estimacin de la grasa corporal basndose en el peso y la altura. Su mdico puede ayudarle a Radiation protection practitioner [place] y a Scientist, forensic o Theatre manager un peso saludable. Haga ejercicio con regularidad Haga ejercicio con regularidad. Esta es una de las prcticas ms importantes que puede hacer por su salud. La mayora de los adultos deben seguir estas pautas:  Optometrist, al menos, 142mnutos de actividad fsica por semana. El ejercicio debe aumentar la frecuencia cardaca y hNature conservation officertranspirar (ejercicio de intensidad moderada).  Hacer ejercicios de fortalecimiento por lo mHalliburton Companypor semana. Agregue esto a su plan de ejercicio de intensidad moderada.  Pasar menos tiempo sentados. Incluso la actividad fsica ligera puede ser beneficiosa. Controle sus niveles de colesterol y lpidos en la sangre Comience a realizarse anlisis de lpidos y cResearch officer, trade unionen la sangre a los 20aos y luego reptalos cada 5aos. Hgase controlar los niveles de colesterol con mayor frecuencia si:  Sus niveles de lpidos y colesterol son altos.  Es mayor de 40aos.  Presenta un alto  riesgo de padecer enfermedades cardacas. Qu debo saber sobre las pruebas de deteccin del cncer? Segn su historia clnica y sus antecedentes familiares, es posible que deba realizarse pruebas de deteccin del cncer en diferentes edades. Esto puede incluir pruebas de deteccin de lo siguiente:  Cncer de mama.  Cncer de cuello uterino.  Cncer colorrectal.  Cncer de piel.  Cncer de pulmn. Qu debo saber sobre la enfermedad cardaca, la diabetes y la hipertensin arterial? Presin arterial y enfermedad cardaca  La hipertensin arterial causa enfermedades cardacas y aSerbiael riesgo de accidente cerebrovascular. Es ms probable que esto se manifieste en las personas que tienen lecturas de presin arterial alta, tienen ascendencia africana o tienen sobrepeso.  Hgase controlar la presin arterial: ? Cada 3 a 5 aos si tiene entre 18 y 369aos. ? Todos los aos si es mayor de 4Virgin[place] Diabetes Realcese exmenes de deteccin de la diabetes con regularidad. Este anlisis revisa el nivel de azcar en la sangre en aPateros Hgase las pruebas de deteccin:  Cada tresaos despus de los 467aosde edad si tiene un peso normal y un bajo riesgo de padecer diabetes.  Con ms frecuencia y a partir de uLindenedad inferior si tiene sobrepeso o un alto riesgo de padecer diabetes. Qu debo saber sobre la prevencin de infecciones? Hepatitis B Si tiene un riesgo ms alto de contraer hepatitis B, debe someterse a un examen de deteccin de este virus. Hable con el mdico para averiguar si tiene riesgo de contraer la infeccin por hepatitis B. Hepatitis C Se recomienda el anlisis a:  THexion Specialty Chemicals1945 y 1965.  Todas  las personas que tengan un riesgo de haber contrado hepatitis C. Enfermedades de transmisin sexual (ETS)  Hgase las pruebas de Programme researcher, broadcasting/film/video de ITS, incluidas la gonorrea y la clamidia, si: ? Es sexualmente activa y es menor de [place]. ? Es mayor de 24aos,  y Investment banker, operational informa que corre riesgo de tener este tipo de infecciones. ? La actividad sexual ha cambiado desde que le hicieron la ltima prueba de deteccin y tiene un riesgo mayor de Best boy clamidia o Radio broadcast assistant. Pregntele al mdico si usted tiene riesgo.  Pregntele al mdico si usted tiene un alto riesgo de Museum/gallery curator VIH. El mdico tambin puede recomendarle un medicamento recetado para ayudar a evitar la infeccin por el VIH. Si elige tomar medicamentos para prevenir el VIH, primero debe [employer] Pride de deteccin del VIH. Luego debe hacerse anlisis cada 79mses mientras est tomando los medicamentos. Embarazo  Si est por dejar de mLibrarian, academic(fase premenopusica) y usted puede quedar eGeistown busque asesoramiento antes de qBotswana  Tome de 400 a [id number](mcg) de cido fAnheuser-Buschsi qIreland  Pida mtodos de control de la natalidad (anticonceptivos) si desea evitar un embarazo no deseado. Osteoporosis y mBrazilLa osteoporosis es una enfermedad en la que los huesos pierden los minerales y la fuerza por el avance de la edad. El resultado pueden ser fracturas en los hIsleton Si tiene 65aos o ms, o si est en riesgo de sufrir osteoporosis y fracturas, pregunte a su mdico si debe:  Hacerse pruebas de deteccin de prdida sea.  Tomar un suplemento de calcio o de vitamina D para reducir el riesgo de fracturas.  Recibir terapia de reemplazo hormonal (TRH) para tratar los sntomas de la menopausia. Siga estas instrucciones en su casa: Estilo de vida  No consuma ningn producto que contenga nicotina o tabaco, como cigarrillos, cigarrillos electrnicos y tabaco de mHigher education careers adviser Si necesita ayuda para dejar de fumar, consulte al mdico.  No consuma drogas.  No comparta agujas.  Solicite ayuda a su mdico si necesita apoyo o informacin para abandonar las drogas. Consumo de alcohol  No beba alcohol si: ? Su mdico le indica no hacerlo. ? Est  embarazada, puede estar embarazada o est tratando de quedar embarazada.  Si bebe alcohol: ? Limite la cantidad que consume de 0 a 1 medida por da. ? Limite la ingesta si est amamantando.  Est atento a la cantidad de alcohol que hay en las bebidas que toma. En los EMilton una medida equivale a una botella de cerveza de 12oz (3555m, un vaso de vino de 5oz (14864mo un vaso de una bebida alcohlica de alta graduacin de 1oz (55m63mInstrucciones generales  Realcese los estudios de rutina de la salud, dentales y de la vPublic librarianantVandlingnfrmele a su mdico si: ? Se siente deprimida con frecuencia. ? Alguna vez ha sido vctima de maltLesharao se siente segura en su casa. Resumen  Adoptar un estilo de vida saludable y recibir atencin preventiva son importantes para promover la salud y el bMusicianiga las instrucciones del mdico acerca de una dieta saludable, el ejercicio y la realizacin de pruebas o exmenes para deteEngineer, building servicesiga las instrucciones del mdico con respecto al control del colesterol y la presin arterial. Esta informacin no tiene comoMarine scientistconsejo del mdico. Asegrese de hacerle al mdico cualquier pregunta que tenga. Document Released: 09/12/2011 Document Revised: 10/14/2018 Document Reviewed: 10/14/2018 Elsevier Patient Education  [place] en los adultos Colonoscopy, Adult [place] colonoscopia es un examen que se realiza para examinar todo el intestino grueso. Durante el examen, se introduce un tubo lubricado y flexible que tiene una cmara en el extremo en el ano que luego llega hasta el colon y a otras partes del intestino grueso. Es posible que le realicen una colonoscopia como parte de las pruebas normales de deteccin de cncer colorrectal o si tiene determinados sntomas, como:  Prdida de glbulos rojos (anemia).  Diarrea que no se resuelve.  Dolor abdominal.   Sangre en la materia fecal (heces). Una colonoscopa puede ayudar a Hydrographic surveyor y [employer] siguientes problemas mdicos, [place] otros:  Tumores.  Plipos.  Inflamacin.  Zonas de hemorragias. Informe al mdico acerca de lo siguiente:  Cualquier alergia que tenga.  Todos los Lyondell Chemical, incluidos vitaminas, hierbas, gotas oftlmicas, cremas y medicamentos de venta libre.  Cualquier problema previo que usted o algn miembro de su familia haya tenido con los anestsicos.  Cualquier enfermedad de la sangre que tenga.  Cirugas previas a las que se someti.  Cualquier afeccin mdica que tenga.  Cualquier problema que haya tenido para defecar. Cules son los riesgos? En general, se trata de un procedimiento seguro. Sin embargo, pueden ocurrir complicaciones, por ejemplo:  Hemorragia.  Un desgarro intestinal.  Ardelia Mems reaccin a los medicamentos administrados durante el examen.  Infeccin (infrecuente). Qu ocurre antes del procedimiento? Restricciones en las comidas y bebidas Siga las indicaciones del mdico respecto de las restricciones de comidas o bebidas, las cuales pueden incluir lo siguiente:  Unos das antes del procedimiento, siga una dieta con bajo contenido de [place]. Evite los frutos secos, las semillas, las frutas pasas, las frutas crudas y las verduras.  Siga una dieta de lquidos transparentes durante 1 a 3das antes del procedimiento. Beba solamente lquidos transparentes, como caldo o sopa transparentes, caf negro o t, jugos transparentes, refrescos o bebidas deportivas transparentes, gelatina y helados de [place]. Evite los lquidos que contengan colorantes rojos o morados.  El da del procedimiento, no coma ni beba nada durante las 2horas antes de Futures trader procedimiento o durante el lapso que el mdico le recomiende. Hasta 2horas antes del procedimiento, puede seguir bebiendo lquidos transparentes, como agua o jugos de fruta sin pulpa.  Preparado intestinal Si le recetaron un preparado intestinal por va oral para limpiar el colon:  Tmelo como se lo haya indicado el mdico. Desde el da anterior al procedimiento, tendr que beber una gran cantidad de lquido medicinal. El lquido har que elimine muchas heces blandas hasta que sean casi claras o de color verdoso claro.  Si la piel o el ano se le irritan debido a la diarrea, puede usar estos productos para Human resources officer irritacin: ? Human resources officer, como las toallitas hmedas para adultos con aloe y vitaminaE. ? Un producto que [employer], como la vaselina.  Si vomita mientras toma el preparado intestinal, descanse durante un mximo de 69mnutos y empiece a tomarlo nuevamente. Si los vmitos continan y no puede tomar el preparado sin vomitar, llame al mdico.  Para limpiarle el colon, tambin pueden darle: ? Medicamentos laxantes. ? Indicaciones sobre cmo usar un enema. Instrucciones generales  Consulte al mdico sobre: ? CQuarry managero suspender sus medicamentos o suplementos. Esto es muy importante si toma suplementos de hierro, medicamentos para la diabetes o anticoagulantes. ? Tomar medicamentos como aspirina e ibuprofeno. Estos medicamentos pueden tener un efecto anticoagulante en la sLeakey No tome estos  medicamentos antes del procedimiento si su mdico le indica que no lo haga.  Pdale a alguien que lo lleve a su casa desde el hospital o la clnica. Qu ocurre durante el procedimiento?   Pueden colocarle una va intravenosa en una vena.  Recibir un medicamento como ayuda para relajarse (sedante).  Para disminuir el riesgo de contraer una infeccin: ? El equipo mdico se lavar o se Transport planner. ? Le lavarn la zona anal con jabn.  Le pedirn que se recueste de costado con las rodillas flexionadas.  El mdico lubricar un tubo [place], delgado y flexible. El tubo tendr Carlota Raspberry y Hali Marry en el extremo.  Le introducirn el tubo en el  ano.  El tubo se mover suavemente a travs del recto y el colon.  Le pondrn aire en el colon para mantenerlo abierto. Es posible que sienta presin o clicos.  La cmara se usar para tomar Clinical research associate.  Pueden extraerle Radford Pax de tejido para examinarla con un microscopio (biopsia).  Si se encuentran pequeos plipos, el mdico puede extirparlos y Oceanographer para detectar si hay presencia de clulas cancerosas.  Una vez que el examen haya finalizado, retirarn el tubo. Este procedimiento puede variar segn el mdico y el hospital. Sander Nephew ocurre despus del procedimiento?  Le controlarn la presin arterial, la frecuencia cardaca, la frecuencia respiratoria y Retail buyer de oxgeno en la sangre hasta que desaparezca el efecto de los medicamentos administrados.  No conduzca vehculos durante las 24horas posteriores al examen.  Es posible que encuentre una pequea cantidad de sangre en la materia fecal.  Es posible que elimine gases y tenga clicos abdominales o meteorismo leves debido al aire que se [place] para inflar el colon durante el examen.  Es su responsabilidad retirar [employer] Inc del procedimiento. Consulte a su mdico o en el departamento donde se realice el procedimiento cundo estarn [employer]. Resumen  [place] colonoscopia es un examen que se realiza para examinar todo el intestino grueso.  Durante una colonoscopa, se introduce un tubo lubricado y flexible en el ano que luego llega hasta el colon y otras partes del intestino grueso.  Siga las indicaciones del mdico con respecto a lo que puede comer y beber antes del procedimiento.  Si le recetaron un preparado intestinal por va oral para limpiar el colon, tmelo como se lo haya indicado el mdico.  Despus del procedimiento le controlarn la presin arterial, la frecuencia cardaca, la frecuencia respiratoria y Retail buyer de oxgeno en la sangre hasta que desaparezca el  efecto de los medicamentos administrados. Esta informacin no tiene Marine scientist el consejo del mdico. Asegrese de hacerle al mdico cualquier pregunta que tenga. Document Released: 07/03/2005 Document Revised: 01/02/2018 Document Reviewed: 12/05/2015 Elsevier Patient Education  [place] de alimentacin restringido en grasas y colesterol Fat and Cholesterol Restricted Eating Plan Seguir una dieta restringida en grasas y colesterol puede ayudar a reducir el riesgo de tener enfermedades cardacas y otras afecciones. El cuerpo necesita grasas y colesterol para las funciones bsicas; no obstante, si estas sustancias se consumen en exceso, pueden ser perjudiciales para la salud. El mdico puede solicitarle anlisis de laboratorio para comprobar sus niveles de grasas (lpidos) y colesterol en la sangre. Ese anlisis le ayuda al mdico a comprender cul es su riesgo de tener ciertas afecciones y si es necesario que haga cambios en su alimentacin. Consulte con su mdico o nutricionista para crear un plan de  alimentacin que sea adecuado para usted. Su plan incluye lo siguiente:  Limitar la ingesta de grasas al _______% o menos del total de caloras por da.  Limitar la ingesta de grasas saturadas al _______% o menos del total de caloras por da.  Limitar la cantidad de colesterol en su dieta a menos de _________mg Darlin Coco.  Comer ___________ g de fibra por da. Cules son algunos consejos para seguir este plan? Pautas generales   Si tiene sobrepeso, consulte con su mdico para adelgazar sin riegos. Perder solo del 5 al 10% de su peso puede mejorar su estado de salud general y Air traffic controller a prevenir enfermedades como la diabetes y las enfermedades cardacas.  Evite lo siguiente: ? Alimentos con Holiday representative. ? Comidas fritas. ? Alimentos que contienen aceites parcialmente hidrogenados, como margarina en barra, algunas margarinas untables, galletitas dulces, galletas  saladas y otros productos horneados.  Limite el consumo de alcohol a no ms de 60mdida por da si es mujer y no est eDundarrach y a 270midas por da si es hombre. Una medida equivale a 12oz (35567mde cerveza, 5oz (148m51me vino o 1oz (44ml28m bebidas alcohlicas de alta graduacin. Leer las etiquetas de los alimentos  Lea las etiquetas de los alimentos para conocer lo siguiente: ? Si contienen grasas trans, aceites parcialmente hidrogenados o altas cantidades de grasas saturadas. Evite los alimentos que contengan grasas saturadas y grasas trans. ? La cantidad de colesterol que contiene cada porcin. Intente comer no ms de 200mg 73molesterol por da. ? La cantidad de fibra que contiene cada porcin. Intente comer al menos Reynolds American30g de fibra Set designerja alimentos con grasas saludables, tales como las siguientes: ? Grasas monoinsaturadas y poliinsaturadas. Estas incluyen aceite de oliva y de canola, semillas de lino, nueces, almendras y semillas. ? Grasas omega-3. Estas se encuentran en alimentos tales como el salmn, la caballa, las sardinas, el atn, el aceite de lino y las semillas de lino molidas.  Elija productos de cereal que tengan cereales integrales. Busque la palabra "integral" en el priEquities trader lista de ingredientes. Al cocinar  Evite frer los alimentos a la hora de la coccin. Algunas opciones de coccin saludables son hornear, hervir, grillaInterior and spatial designerr a la parrilla.  Consuma ms comida casera y menos de restaurante, de bares y comida rpida.  Evite cocinar usando grasas saturadas. ? Las grasas saturadas de origen animal incluyen carnes, mantecWeyauwegama. ? Las grasas saturadas de origen vegetal incluyen aceite de palma, de palmiste y de coco. Planificacin de las comidas   En las comidas, imagine dividir su plato en cuartos: ? Llene la mitad del plato con verduras y ensaladas de hojas verdes. ? Llene un cuarto del plato con cereales integrales.  ? Llene un cuarto del plato con alimentos con protenas magras.  Coma pescado con alto contenido de grasas omega-3 al menos [employer]ys[employer]us por semana.  Coma ms alimentos que contengan fibra, como cereales integrNyackoles, manzanParkdaleliChickamaugahoTiceantes y cebadaRwandas alimentos favorecen niveles de colesterol saludables en la sangre. Alimentos recomendados CerealUniversal Health panes, galletas, cereales y pastas de integrales o de trigo integral. Avena sin endulzar, trigo bulgur, cebada, quinua o arroz integral. Tortillas de harina de maz o trigo integral. VerdurHolland Commonsuras frescas o congeladas (crudas, al vapor, asadas o grilladas). Ensaladas de hojas verdes. FrutasLambert ModyasLambert Modyas, en conserva (en su jugo natural) o frutas congeladas. Carnes y otros alimentos ricos en  protenas  Carne de res molida (al 85% o ms [place]), carne de res de animales alimentados con pastos o carne de res sin la grasa. Pollo o pavo sin piel. Carne de pollo o de [place]. Cerdo sin la grasa. Todos los pescados y frutos de mar. Claras de huevo. Porotos, guisantes o lentejas secos. Frutos secos o semillas sin sal. Frijoles enlatados sin sal. Mantequillas de frutos secos naturales sin azcar ni aceites agregados. Lcteos  Productos lcteos descremados o semidescremados, como [employer] o al 1%, quesos reducidos en grasas o al 2%, queso cottage o ricota con bajo contenido de [place] o sin contenido de [place], o yogur natural descremado o semidescremado. Grasas y aceites  Margarina untable que no contenga grasas trans. [place] y condimentos para ensaladas livianos o reducidos en grasas. [place]. Aceites de oliva, canola, ssamo o crtamo. Es posible que los productos que se enumeran ms arriba no sean una lista completa de las bebidas o los alimentos recomendados. Comunquese con su nutricionista para conocer ms opciones. Alimentos que se deben evitar Cereales  Pan blanco.  Pastas blancas. Arroz blanco. Pan de maz. Bagels, pasteles y croissants. Galletas saladas y colaciones que contengan grasas trans y aceites hidrogenados. Verduras  Verduras cocinadas con salsas de queso, crema o mantequilla. Verduras fritas. Lambert Mody  Fruta enlatada en almbar espeso. Frutas con salsa de crema o Lavina. Frutas cocidas en aceite. Carnes y otros alimentos ricos en protenas  Cortes de carne con grasa. Costillas, alas de pollo, tocineta, salchicha, mortadela, salame, chinchulines, tocino, perros calientes, salchichas alemanas y embutidos envasados. Hgado y otros rganos. Huevos enteros y yemas de [place]. Pollo y pavo con piel. Carne frita. Lcteos  Leche entera o al 2%, crema, mezcla de [place] y crema, y queso crema. Quesos enteros. Yogur entero o endulzado. Quesos con toda su grasa. [place] no lcteas y coberturas batidas. Quesos procesados, quesos para untar y [place]. Bebidas  Alcohol. Bebidas endulzadas con azcar, como refrescos, limonada y bebidas frutales. Grasas y aceites  [place], [place] en barra, [place] de [place], [place], [place] clarificada o grasa de tocino. Aceites de coco, de palmiste y de palma. Dulces y postres  Jarabe de maz, azcares, miel y Control and instrumentation engineer. Caramelos. Mermelada y [place]. Chrissie Noa. Cereales endulzados. Galletas, pasteles, bizcochuelos, donas, muffins y helado. Es posible que los productos que se enumeran ms arriba no sean una lista completa de los alimentos y las bebidas que se Higher education careers adviser. Comunquese con su nutricionista para obtener ms informacin. Resumen  El cuerpo necesita grasas y colesterol para las funciones bsicas. No obstante, si estas sustancias se consumen en exceso, pueden ser perjudiciales para la salud.  Consulte con su mdico y su nutricionista para seguir una dieta con bajo contenido de grasas y colesterol. Hacerlo puede ayudar a disminuir su riesgo de tener enfermedades cardacas y otras afecciones.  Elija grasas  saludables, como las grasas monoinsaturadas y Pharmacist, hospital, y alimentos con alto contenido de cidos grasos omega-3.  Consuma alimentos ricos en fibras, como cereales integrales, frijoles, guisantes, frutas y verduras.  Limite o evite el consumo de alcohol, las comidas fritas y los alimentos con alto contenido de grasas saturadas, aceites parcialmente hidrogenados y Location manager. Esta informacin no tiene Marine scientist el consejo del mdico. Asegrese de hacerle al mdico cualquier pregunta que tenga. Document Released: 09/23/2005 Document Revised: 08/11/2017 Document Reviewed: 08/11/2017 Elsevier Patient Education  2020 [employer].

## 2019-04-15 NOTE — Progress Notes (Signed)
Patient: Latoya [name], Female    DOB: 1971/08/06, 48 y.o.   MRN: [id number] Visit Date: 04/15/2019  Today's Provider: Delsa Grana, PA-C   Chief Complaint  Patient presents with  . Annual Exam   Completed with the assistance of Spanish interpreter  Subjective:    Annual physical exam [facility] [name] is a 48 y.o. female who presents today for health maintenance and complete physical. She feels well. She reports not exercising. She reports she is sleeping well.  ----------------------------------------------------------------- Hx of prediabetes and obesity, has gained weight over the past 6 months or so, tried to start working out again over the last month before work and she works at 5 am.  Still trying to eat healthy foods.    Wt Readings from Last 5 Encounters:  04/15/19 199 lb 6.4 oz (90.4 kg)  06/11/18 192 lb (87.1 kg)  05/21/18 186 lb 8 oz (84.6 kg)  04/07/18 187 lb 12.8 oz (85.2 kg)  02/11/18 188 lb (85.3 kg)   Past LFT elevated and fatty liver disease, still trying to follow healthy diet for that.  With past visits had some very heavy menses that were irregular, with f/up US which showed normal endometrial lining and small polyp, she did not f/up with OBGYN and has not had a period since last year.  Only supplementing with OTC vitamins.  Due for colonscopy and labs - not fasting today, will have to arrange to come back when fasting   Review of Systems  Constitutional: Negative.   HENT: Negative.   Eyes: Negative.   Respiratory: Negative.   Cardiovascular: Negative.   Gastrointestinal: Negative.   Endocrine: Negative.   Genitourinary: Negative.   Musculoskeletal: Negative.   Skin: Negative.   Allergic/Immunologic: Negative.   Neurological: Negative.   Hematological: Negative.   Psychiatric/Behavioral: Negative.   All other systems reviewed and are negative.   Social History      She  reports that she has never smoked. She has never used  smokeless tobacco. She reports current alcohol use. She reports that she does not use drugs.       Social History   Socioeconomic History  . Marital status: Married    Spouse name: Not on file  . Number of children: Not on file  . Years of education: Not on file  . Highest education level: Not on file  Occupational History  . Not on file  Social Needs  . Financial resource [name]: Not on file  . Food insecurity    Worry: Not on file    Inability: Not on file  . Transportation needs    Medical: Not on file    Non-medical: Not on file  Tobacco Use  . Smoking status: Never Smoker  . Smokeless tobacco: Never Used  Substance and Sexual Activity  . Alcohol use: Yes    Comment: rarely  . Drug use: No  . Sexual activity: Yes    Partners: Male    Birth control/protection: Surgical    Comment: Monogamous with husband  Lifestyle  . Physical activity    Days per week: 3 days    Minutes per session: 60 min  . Stress: Not at all  Relationships  . Social Herbalist on phone: Not on file    Gets together: Not on file    Attends religious service: Not on file    Active member of club or organization: Not on file    Attends meetings  of clubs or organizations: Not on file    Relationship status: Not on file  Other Topics Concern  . Not on file  Social History Narrative  . Not on file    Past Medical History:  Diagnosis Date  . Cervical dysplasia, moderate 08/10/2012  . Depression    Pt states situational with health scares with abnormal Paps, procedures, also breast lump with mammograms and ultrasounds, concerns and depression resolved with the medical problems and she was never medically treated  . History of abnormal cervical Pap smear 2014/12/16  . Papanicolaou smear of cervix with low grade squamous intraepithelial lesion (LGSIL) 07/01/2012   "Cannot rule out a higher grade lesion" added on pathology report Rec a colpo   . Steatohepatitis, nonalcoholic 10/09/7251   Dx  66/44/03 with RUQ [place] + and mildly elevated LFT's     Patient Active Problem List   Diagnosis Date Noted  . Steatohepatitis, nonalcoholic 47/42/5956  . Elevated LFTs 02/11/2018  . Obesity (BMI 30.0-34.9) 02/04/2018  . Hyperlipidemia 05/29/2016  . Prediabetes 05/29/2016    Past Surgical History:  Procedure Laterality Date  . CERVICAL BIOPSY  W/ LOOP ELECTRODE EXCISION    . CESAREAN SECTION     one previous  . TONSILLECTOMY AND ADENOIDECTOMY  48 yo  . TUBAL LIGATION      Family History        Family Status  Relation Name Status  . Mother  Alive  . Father  Deceased       16-Dec-2000 - gangrene or stomach cancer  . Sister  (Not Specified)        Her family history includes Cancer in her father and mother; Diabetes in her father, mother, and sister; Heart disease in her father; Hypertension in her father and mother.      No Known Allergies   Current Outpatient Medications:  Marland Kitchen  Multiple Vitamins-Minerals (MULTIVITAMIN WITH MINERALS) tablet, Take 1 tablet by mouth daily., Disp: 90 tablet, Rfl: 3   Patient Care Team: Delsa Grana, PA-C as PCP - General (Family Medicine)      Objective:    Vitals:   04/15/19 1508  BP: 134/76  Pulse: 85  Resp: 18  Temp: 99.1 F (37.3 C)  SpO2: 96%  Weight: 199 lb 6.4 oz (90.4 kg)   Body mass index is 35.89 kg/m.   Physical Exam Vitals signs and nursing note reviewed.  Constitutional:      General: She is not in acute distress.    Appearance: Normal appearance. She is well-developed. She is obese. She is not ill-appearing, toxic-appearing or diaphoretic.  HENT:     Head: Normocephalic and atraumatic.     Right Ear: Tympanic membrane, ear canal and external ear normal.     Left Ear: Tympanic membrane, ear canal and external ear normal.     Nose: Nose normal. No congestion or rhinorrhea.     Mouth/Throat:     Mouth: Mucous membranes are moist.     Pharynx: Oropharynx is clear. Uvula midline. No oropharyngeal exudate or posterior  oropharyngeal erythema.  Eyes:     General: Lids are normal. No scleral icterus.    Conjunctiva/sclera: Conjunctivae normal.     Pupils: Pupils are equal, round, and reactive to light.  Neck:     Musculoskeletal: Full passive range of motion without pain, normal range of motion and neck supple.     Thyroid: No thyroid mass, thyromegaly or thyroid tenderness.     Trachea: Phonation normal. No tracheal deviation.  Cardiovascular:     Rate and Rhythm: Normal rate and regular rhythm.     Pulses: Normal pulses.          Radial pulses are 2+ on the right side and 2+ on the left side.       Posterior tibial pulses are 2+ on the right side and 2+ on the left side.     Heart sounds: Normal heart sounds. No murmur. No friction rub. No gallop.   Pulmonary:     Effort: Pulmonary effort is normal. No respiratory distress.     Breath sounds: Normal breath sounds. No stridor. No wheezing, rhonchi or rales.  Chest:     Chest wall: No tenderness.  Abdominal:     General: Bowel sounds are normal. There is no distension.     Palpations: Abdomen is soft.     Tenderness: There is no abdominal tenderness. There is no guarding or rebound.  Musculoskeletal: Normal range of motion.        General: No deformity.  Lymphadenopathy:     Cervical: No cervical adenopathy.  Skin:    General: Skin is warm and dry.     Capillary Refill: Capillary refill takes less than 2 seconds.     Coloration: Skin is not jaundiced or pale.     Findings: No rash.  Neurological:     Mental Status: She is alert.     Motor: No weakness or abnormal muscle tone.     Coordination: Coordination normal.     Gait: Gait normal.  Psychiatric:        Mood and Affect: Mood normal.        Speech: Speech normal.        Behavior: Behavior normal.        Thought Content: Thought content normal.        Judgment: Judgment normal.      Depression Screen PHQ 2/9 Scores 04/15/2019 06/11/2018 05/21/2018 02/04/2018  PHQ - 2 Score 0 0 0 0  PHQ-  9 Score - - - 0     Office Visit from 04/15/2019 in [place]  AUDIT-C Score  0          Assessment & Plan:     Routine Health Maintenance and Physical Exam  Exercise Activities and Dietary recommendations Goals - decrease calories with her healthy diet efforts, increase exercise - AHA recommendations given with AVS handout Discussed net negative calories for weightloss, improvement of cholesterol.    Discussed health benefits of physical activity, and encouraged her to engage in regular exercise appropriate for her age and condition.   Immunization History  Administered Date(s) Administered  . Tdap 06/14/2016    Health Maintenance  Topic Date Due  . COLONOSCOPY  04/06/2019  . INFLUENZA VACCINE  05/08/2019  . PAP SMEAR-Modifier  12/06/2019  . MAMMOGRAM  03/19/2020  . TETANUS/TDAP  06/14/2026  . HIV Screening  Completed      Mammogram scheduled a few weeks from now PAP UTD and referred previously to OBGYN Colonoscopy due now 23 - ordered today  Pt to come back for fasting labs.  I expect that cholesterol and A1C may be same or worse than last since pt has gained weight and only started to work on her diet and start exercising again a few weeks ago.   Problem List Items Addressed This Visit      Other   Hyperlipidemia    Recheck FLP and LFTs Increased exercise, low fat diet,  encouraged weight loss      Relevant Orders   Lipid panel (Completed)   COMPLETE METABOLIC PANEL WITH GFR (Completed)   Prediabetes    Recheck A1C, BMI/weight increased Last A1C 5.9 with high cholesterol and elevated LFT's was to work on lifestyle changes      Relevant Orders   Lipid panel (Completed)   COMPLETE METABOLIC PANEL WITH GFR (Completed)   Hemoglobin A1c (Completed)   Class 2 severe obesity due to excess calories with serious comorbidity and body mass index (BMI) of 35.0 to 35.9 in adult ([place])    BMI increasing/worse Has in the past elevated LFTs, fatty  liver disease, high cholesterol and prediabetes with A1C 5.9% Recheck all today Counseled on diet and exercise      Relevant Orders   T3, free (Completed)   T4, free (Completed)   Lipid panel (Completed)   TSH (Completed)   COMPLETE METABOLIC PANEL WITH GFR (Completed)   Hemoglobin A1c (Completed)    Other Visit Diagnoses    Encounter for general adult medical examination with abnormal findings    -  Primary   BMI worse, CPE done, labs and colonoscopy due, PHQ and Audit screen negative   Relevant Orders   T3, free (Completed)   T4, free (Completed)   Lipid panel (Completed)   TSH (Completed)   CBC with Differential/Platelet (Completed)   COMPLETE METABOLIC PANEL WITH GFR (Completed)   Hemoglobin A1c (Completed)   Colon cancer screening       Relevant Orders   Ambulatory referral to Gastroenterology   Screening for deficiency anemia       Relevant Orders   CBC with Differential/Platelet (Completed)   Screening for endocrine, metabolic and immunity disorder       r/o thyroid dysfunction, screen CBC/diff and chemistry   Relevant Orders   T3, free (Completed)   T4, free (Completed)   TSH (Completed)   CBC with Differential/Platelet (Completed)   COMPLETE METABOLIC PANEL WITH GFR (Completed)       Delsa Grana, PA-C 04/15/19 3:19 PM  [facility]

## 2019-04-19 ENCOUNTER — Telehealth: Payer: Self-pay | Admitting: Family Medicine

## 2019-04-19 ENCOUNTER — Other Ambulatory Visit: Payer: BC Managed Care – PPO

## 2019-04-19 ENCOUNTER — Encounter: Payer: Self-pay | Admitting: Family Medicine

## 2019-04-19 ENCOUNTER — Other Ambulatory Visit: Payer: Self-pay

## 2019-04-19 DIAGNOSIS — Z0001 Encounter for general adult medical examination with abnormal findings: Secondary | ICD-10-CM

## 2019-04-19 DIAGNOSIS — Z130 Encounter for screening for diseases of the blood and blood-forming organs and certain disorders involving the immune mechanism: Secondary | ICD-10-CM

## 2019-04-19 DIAGNOSIS — Z13220 Encounter for screening for lipoid disorders: Secondary | ICD-10-CM

## 2019-04-19 DIAGNOSIS — R946 Abnormal results of thyroid function studies: Secondary | ICD-10-CM | POA: Diagnosis not present

## 2019-04-19 DIAGNOSIS — R7309 Other abnormal glucose: Secondary | ICD-10-CM | POA: Diagnosis not present

## 2019-04-20 LAB — CBC WITH DIFFERENTIAL/PLATELET
Absolute Monocytes: 403 cells/uL (ref 200–950)
Basophils Absolute: 50 cells/uL (ref 0–200)
Basophils Relative: 0.8 %
Eosinophils Absolute: 118 cells/uL (ref 15–500)
Eosinophils Relative: 1.9 %
HCT: 40.8 % (ref 35.0–45.0)
Hemoglobin: 13.4 g/dL (ref 11.7–15.5)
Lymphs Abs: 2765 cells/uL (ref 850–3900)
MCH: 30.2 pg (ref 27.0–33.0)
MCHC: 32.8 g/dL (ref 32.0–36.0)
MCV: 92.1 fL (ref 80.0–100.0)
MPV: 11.3 fL (ref 7.5–12.5)
Monocytes Relative: 6.5 %
Neutro Abs: 2864 cells/uL (ref 1500–7800)
Neutrophils Relative %: 46.2 %
Platelets: 341 10*3/uL (ref 140–400)
RBC: 4.43 10*6/uL (ref 3.80–5.10)
RDW: 12.4 % (ref 11.0–15.0)
Total Lymphocyte: 44.6 %
WBC: 6.2 10*3/uL (ref 3.8–10.8)

## 2019-04-20 LAB — COMPLETE METABOLIC PANEL WITH GFR
AG Ratio: 1.6 (calc) (ref 1.0–2.5)
ALT: 29 U/L (ref 6–29)
AST: 26 U/L (ref 10–35)
Albumin: 4.4 g/dL (ref 3.6–5.1)
Alkaline phosphatase (APISO): 106 U/L (ref 37–153)
BUN: 12 mg/dL (ref 7–25)
CO2: 27 mmol/L (ref 20–32)
Calcium: 9.4 mg/dL (ref 8.6–10.4)
Chloride: 104 mmol/L (ref 98–110)
Creat: 0.7 mg/dL (ref 0.50–1.05)
GFR, Est African American: 117 mL/min/{1.73_m2} (ref >=60)
GFR, Est Non African American: 101 mL/min/{1.73_m2} (ref >=60)
Globulin: 2.7 g/dL (calc) (ref 1.9–3.7)
Glucose, Bld: 109 mg/dL — ABNORMAL HIGH (ref 65–99)
Potassium: 4.2 mmol/L (ref 3.5–5.3)
Sodium: 138 mmol/L (ref 135–146)
Total Bilirubin: 0.3 mg/dL (ref 0.2–1.2)
Total Protein: 7.1 g/dL (ref 6.1–8.1)

## 2019-04-20 LAB — LIPID PANEL
Cholesterol: 215 mg/dL — ABNORMAL HIGH (ref <200)
HDL: 60 mg/dL (ref >=50)
LDL Cholesterol (Calc): 138 mg/dL (calc) — ABNORMAL HIGH
Non-HDL Cholesterol (Calc): 155 mg/dL (calc) — ABNORMAL HIGH (ref <130)
Total CHOL/HDL Ratio: 3.6 (calc) (ref <5.0)
Triglycerides: 75 mg/dL (ref <150)

## 2019-04-20 LAB — T3, FREE: T3, Free: 3.2 pg/mL (ref 2.3–4.2)

## 2019-04-20 LAB — T4, FREE: Free T4: 1 ng/dL (ref 0.8–1.8)

## 2019-04-20 LAB — HEMOGLOBIN A1C
Hgb A1c MFr Bld: 6.3 % of total Hgb — ABNORMAL HIGH (ref <5.7)
Mean Plasma Glucose: 134 (calc)
eAG (mmol/L): 7.4 (calc)

## 2019-04-20 LAB — TSH: TSH: 0.76 mIU/L

## 2019-04-27 DIAGNOSIS — Z6830 Body mass index (BMI) 30.0-30.9, adult: Secondary | ICD-10-CM | POA: Insufficient documentation

## 2019-04-27 NOTE — Assessment & Plan Note (Addendum)
Recheck A1C, BMI/weight increased Last A1C 5.9 with high cholesterol and elevated LFT's was to work on lifestyle changes

## 2019-04-27 NOTE — Assessment & Plan Note (Signed)
Recheck FLP and LFTs Increased exercise, low fat diet, encouraged weight loss

## 2019-04-27 NOTE — Assessment & Plan Note (Signed)
BMI increasing/worse Has in the past elevated LFTs, fatty liver disease, high cholesterol and prediabetes with A1C 5.9% Recheck all today Counseled on diet and exercise

## 2019-05-06 NOTE — Telephone Encounter (Signed)
error 

## 2019-05-10 ENCOUNTER — Ambulatory Visit
Admission: RE | Admit: 2019-05-10 | Discharge: 2019-05-10 | Disposition: A | Discharge status: Home / Self Care | Payer: Self-pay | Source: Ambulatory Visit | Attending: Family Medicine | Admitting: Family Medicine

## 2019-05-10 ENCOUNTER — Other Ambulatory Visit: Payer: Self-pay

## 2019-05-10 DIAGNOSIS — Z1231 Encounter for screening mammogram for malignant neoplasm of breast: Secondary | ICD-10-CM

## 2019-05-17 ENCOUNTER — Encounter: Payer: Self-pay | Admitting: Family Medicine

## 2020-03-14 IMAGING — US US ABDOMEN LIMITED
1 series · 14 of 25 positions shown · non-contrast
Comparison: None.

CLINICAL DATA: Elevated LFTs

EXAM:
ULTRASOUND ABDOMEN LIMITED RIGHT UPPER QUADRANT

[Series 1: us abdomen limited · 0.14mm/px · 14 of 50 slices shown]
[im 1/50]
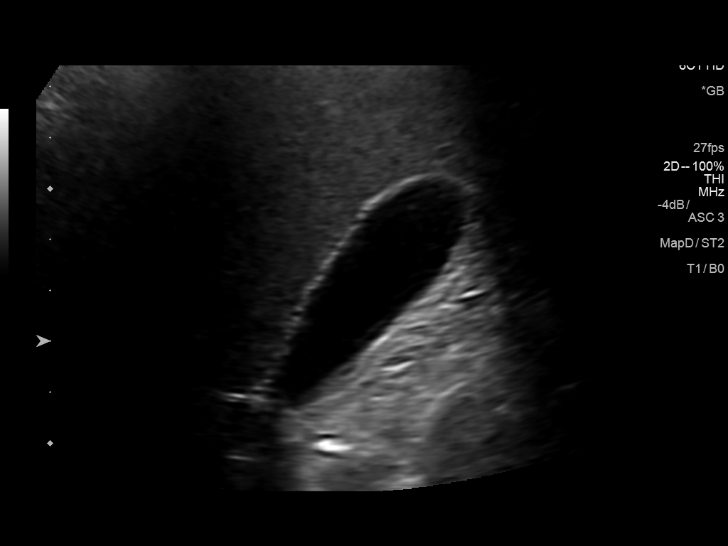
[im 5/50]
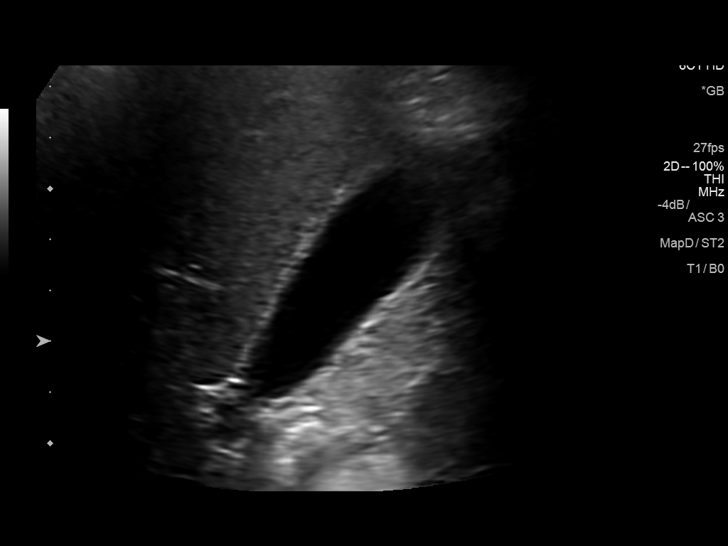
[im 9/50]
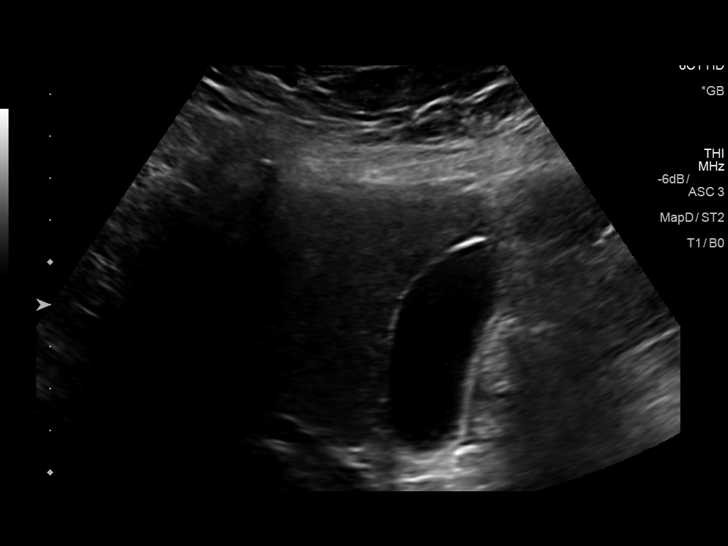
[im 13/50]
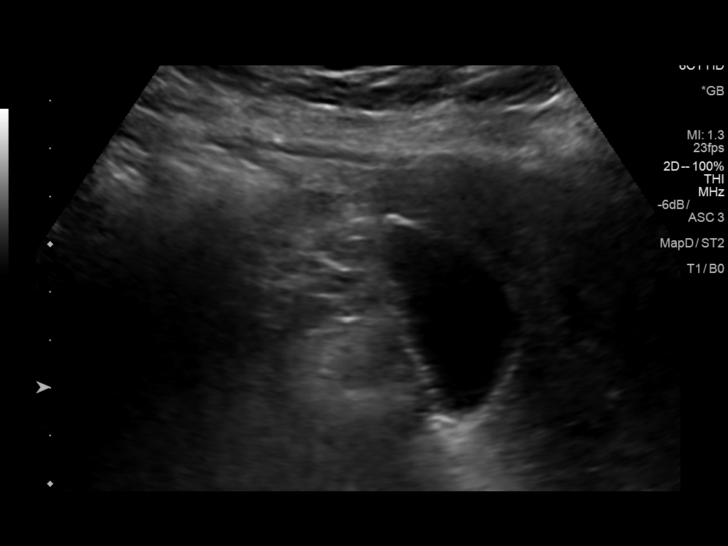
[im 17/50]
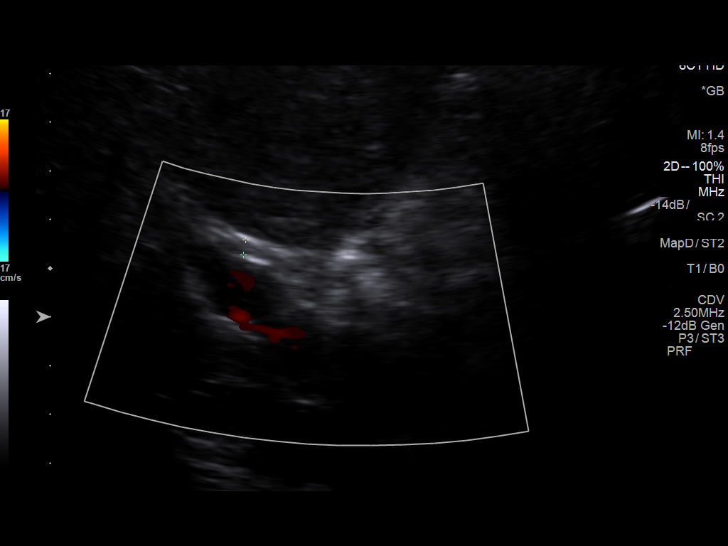
[im 19/50]
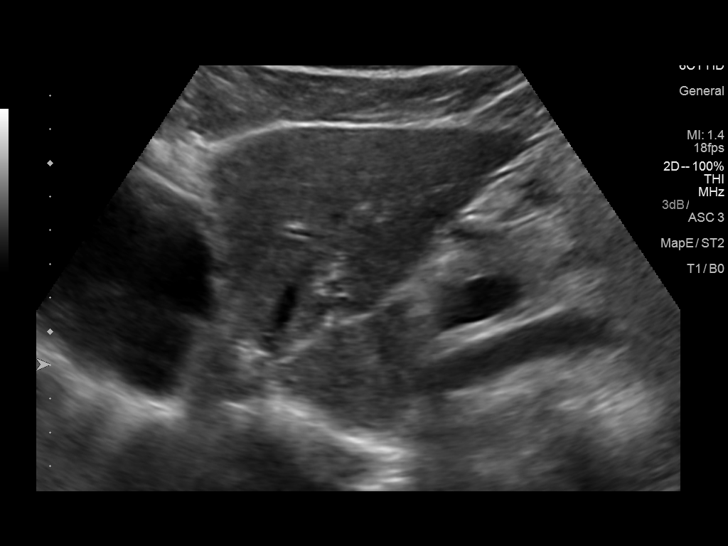
[im 23/50]
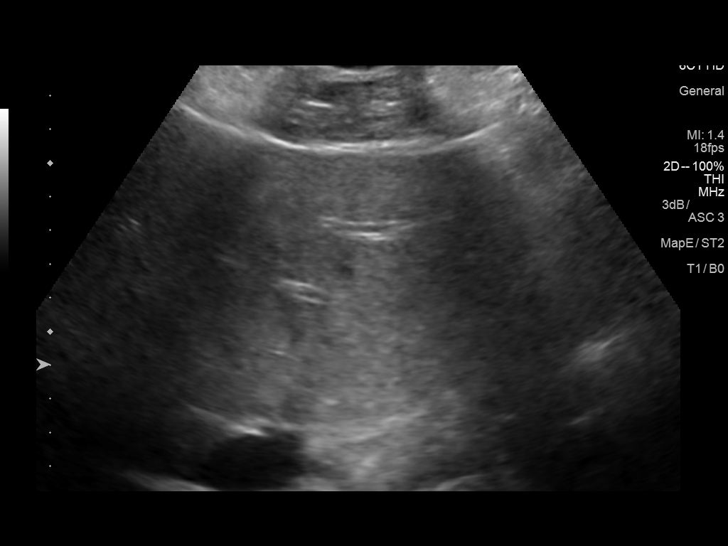
[im 27/50]
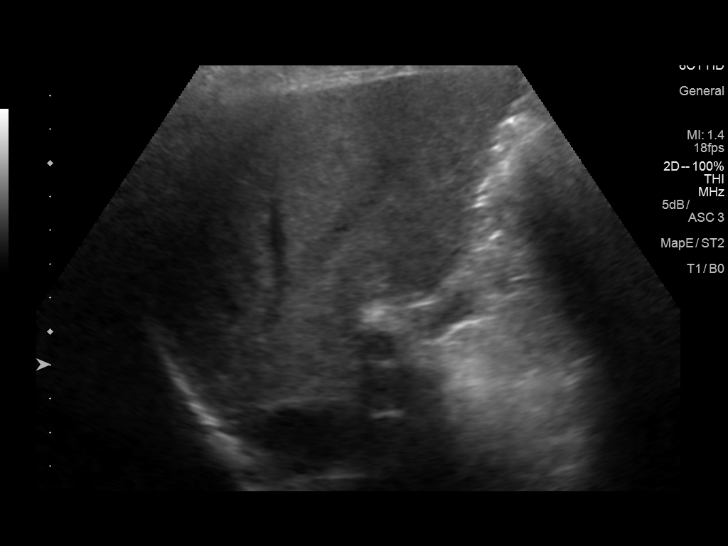
[im 31/50]
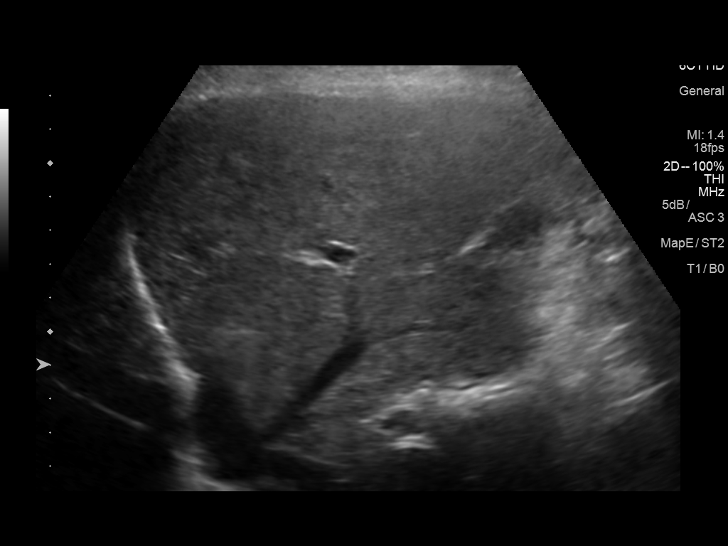
[im 33/50]
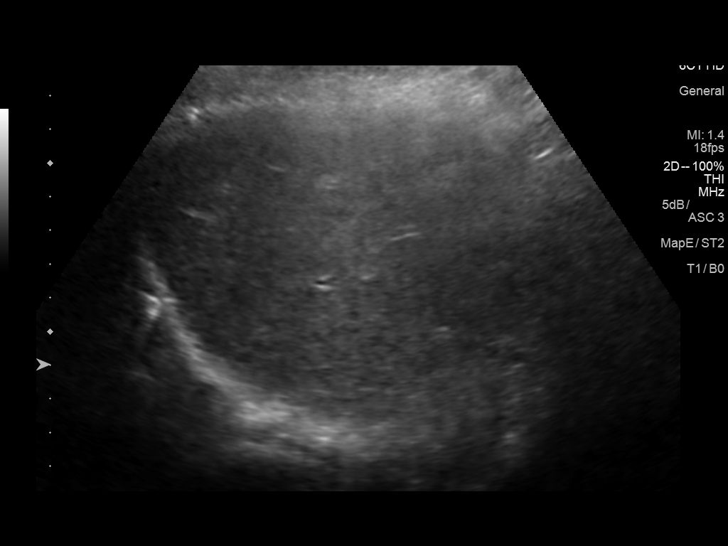
[im 37/50]
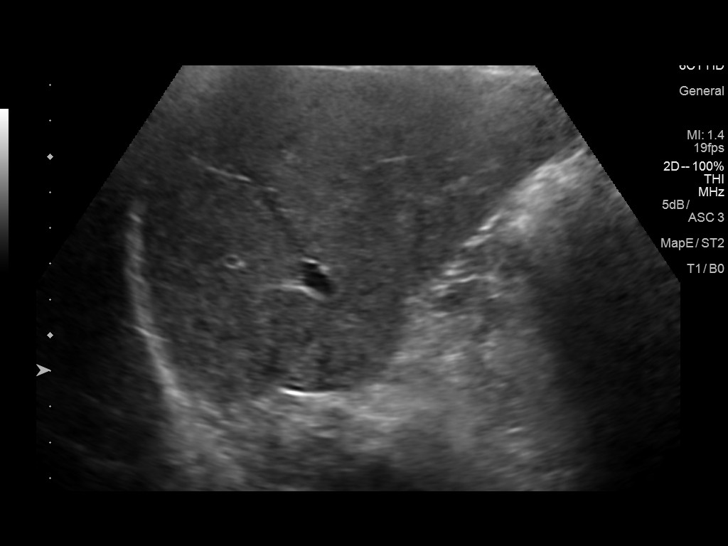
[im 41/50]
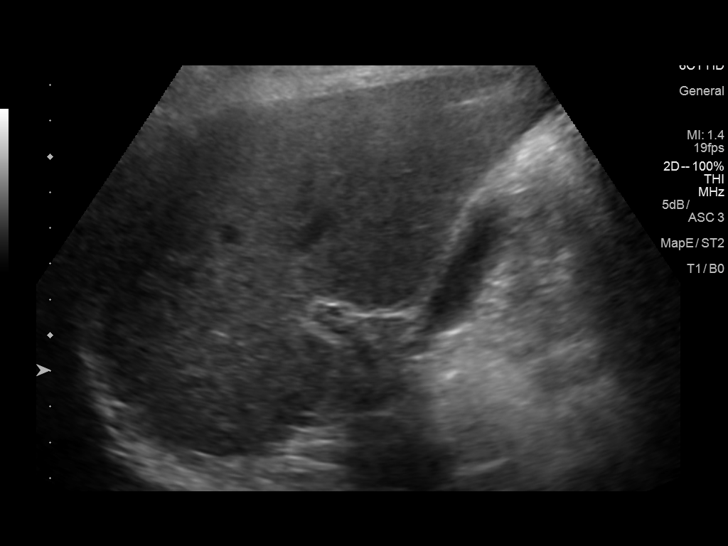
[im 45/50]
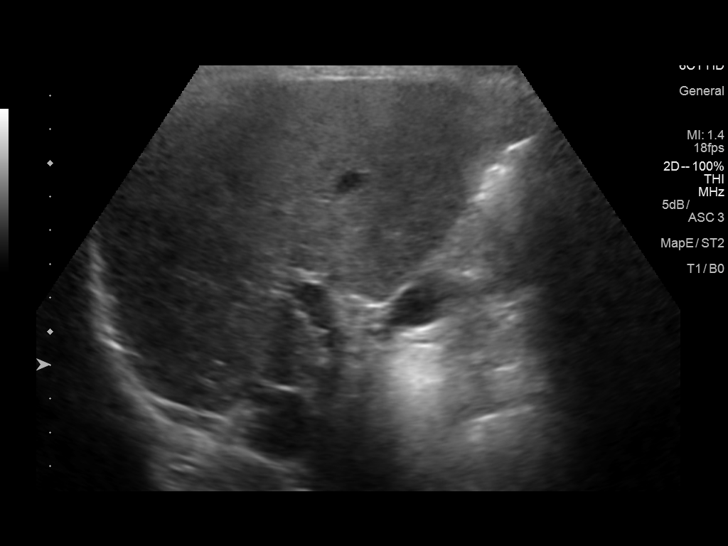
[im 50/50]
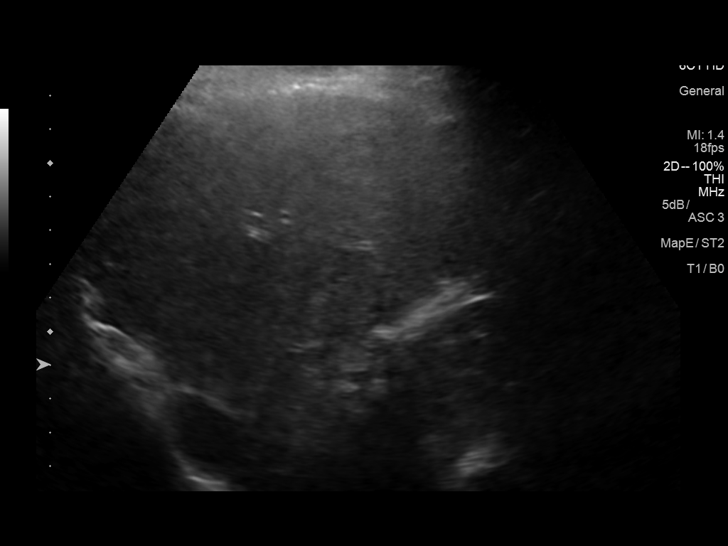

[14 of 25 positions shown; findings below may reference images not displayed]

FINDINGS: Gallbladder:

No gallstones or wall thickening visualized. No sonographic Murphy
sign noted by sonographer.

Common bile duct:

Diameter: 3 mm

Liver:

Diffusely increased in echogenicity likely related to fatty
infiltration. No focal mass lesion is noted. Portal vein is patent
on color Doppler imaging with normal direction of blood flow towards
the liver.
IMPRESSION: Changes consistent with fatty infiltration.

## 2020-04-18 ENCOUNTER — Other Ambulatory Visit: Payer: Self-pay

## 2020-04-18 ENCOUNTER — Ambulatory Visit (INDEPENDENT_AMBULATORY_CARE_PROVIDER_SITE_OTHER): Payer: BC Managed Care – PPO | Admitting: Nurse Practitioner

## 2020-04-18 ENCOUNTER — Encounter: Payer: Self-pay | Admitting: Nurse Practitioner

## 2020-04-18 VITALS — BP 128/74 | HR 80 | Temp 97.8°F | Resp 14 | Ht 62.5 in | Wt 174.0 lb

## 2020-04-18 DIAGNOSIS — Z124 Encounter for screening for malignant neoplasm of cervix: Secondary | ICD-10-CM

## 2020-04-18 DIAGNOSIS — E669 Obesity, unspecified: Secondary | ICD-10-CM | POA: Diagnosis not present

## 2020-04-18 DIAGNOSIS — Z1211 Encounter for screening for malignant neoplasm of colon: Secondary | ICD-10-CM

## 2020-04-18 DIAGNOSIS — Z0000 Encounter for general adult medical examination without abnormal findings: Secondary | ICD-10-CM | POA: Diagnosis not present

## 2020-04-18 DIAGNOSIS — Z0001 Encounter for general adult medical examination with abnormal findings: Secondary | ICD-10-CM | POA: Diagnosis not present

## 2020-04-18 DIAGNOSIS — R7303 Prediabetes: Secondary | ICD-10-CM | POA: Diagnosis not present

## 2020-04-18 DIAGNOSIS — E782 Mixed hyperlipidemia: Secondary | ICD-10-CM

## 2020-04-18 DIAGNOSIS — Z1231 Encounter for screening mammogram for malignant neoplasm of breast: Secondary | ICD-10-CM

## 2020-04-18 NOTE — Patient Instructions (Signed)
Drink plenty of water  Follow a low fat/low cholesterol, low carbohydrate, low sugar diet  Get at least 20 minutes of exercise 4 times per week  Follow up yearly  Schedule your mammogram  Pap completed today  Labs completed today will call with results  Cologuard ordered will be mailed to your home for colorectal cancer screening  Performed clinic foot exam  Monthly self breast examination seeking medical attention for abnormal findings.  Foot exam daily at home seek medical attention for break in skin integrity or symptoms of infection such as redness or swelling or non healing breakdown/wound  Blood pressure goal 140/90 or less

## 2020-04-18 NOTE — Progress Notes (Signed)
Established Patient Office Visit  Subjective:  Patient ID: Latoya [name], female    DOB: 1971-07-20  Age: 49 y.o. MRN: [id number]  CC:  Chief Complaint  Patient presents with  . Annual Exam    HPI [facility] interpretor present. Pt is a 49 year old female presenting for yearly general adult examination. She denied cp, ct, gu/gi sxs, pain, sob, edema, palpitation, or recent 12 month falls. Negative screenings. She is due for mammo, pap, colon cancer screen. She would like pap completed today, order for mammo she will call to schedule or on mychart online, and Cologuard preferred will order. The pt has been attempting to control DM2 with lifestyle diet and exercise she reports.  Vaccinations are utd. Labs to be collected today.   Past Medical History:  Diagnosis Date  . Cervical dysplasia, moderate 08/10/2012  . Depression    Pt states situational with health scares with abnormal Paps, procedures, also breast lump with mammograms and ultrasounds, concerns and depression resolved with the medical problems and she was never medically treated  . History of abnormal cervical Pap smear 2016  . Papanicolaou smear of cervix with low grade squamous intraepithelial lesion (LGSIL) 07/01/2012   "Cannot rule out a higher grade lesion" added on pathology report Rec a colpo   . Steatohepatitis, nonalcoholic 03/25/5092   Dx 26/71/24 with RUQ [place] + and mildly elevated LFT's    Past Surgical History:  Procedure Laterality Date  . CERVICAL BIOPSY  W/ LOOP ELECTRODE EXCISION    . CESAREAN SECTION     one previous  . TONSILLECTOMY AND ADENOIDECTOMY  49 yo  . TUBAL LIGATION      Family History  Problem Relation Age of Onset  . Cancer Mother        uterine  . Diabetes Mother   . Hypertension Mother   . Diabetes Father   . Hypertension Father   . Cancer Father        stomach cancer ?  Marland Kitchen Heart disease Father   . Diabetes Sister     Social History   Socioeconomic History  . Marital  status: Married    Spouse name: Not on file  . Number of children: Not on file  . Years of education: Not on file  . Highest education level: Not on file  Occupational History  . Not on file  Tobacco Use  . Smoking status: Never Smoker  . Smokeless tobacco: Never Used  Vaping Use  . Vaping Use: Never used  Substance and Sexual Activity  . Alcohol use: Yes    Comment: rarely  . Drug use: No  . Sexual activity: Yes    Partners: Male    Birth control/protection: Surgical    Comment: Monogamous with husband  Other Topics Concern  . Not on file  Social History Narrative  . Not on file   Social Determinants of Health   Financial Resource Strain:   . Difficulty of Paying Living Expenses:   Food Insecurity:   . Worried About Charity fundraiser in the Last Year:   . Arboriculturist in the Last Year:   Transportation Needs:   . Film/video editor (Medical):   Marland Kitchen Lack of Transportation (Non-Medical):   Physical Activity:   . Days of Exercise per Week:   . Minutes of Exercise per Session:   Stress:   . Feeling of Stress :   Social Connections:   . Frequency of Communication with Friends  and Family:   . Frequency of Social Gatherings with Friends and Family:   . Attends Religious Services:   . Active Member of Clubs or Organizations:   . Attends Archivist Meetings:   Marland Kitchen Marital Status:   Intimate Partner Violence:   . Fear of Current or Ex-Partner:   . Emotionally Abused:   Marland Kitchen Physically Abused:   . Sexually Abused:     Outpatient Medications Prior to Visit  Medication Sig Dispense Refill  . Multiple Vitamins-Minerals (MULTIVITAMIN WITH MINERALS) tablet Take 1 tablet by mouth daily. 90 tablet 3   No facility-administered medications prior to visit.    No Known Allergies  ROS Review of Systems  All other systems reviewed and are negative.     Objective:    Physical Exam Vitals and nursing note reviewed. Exam conducted with a chaperone present.    Constitutional:      Appearance: Normal appearance. She is well-developed and well-groomed. She is not ill-appearing or toxic-appearing.  HENT:     Head: Normocephalic.     Right Ear: Hearing, ear canal and external ear normal.     Left Ear: Hearing, ear canal and external ear normal.     Nose: Nose normal. No congestion or rhinorrhea.     Mouth/Throat:     Lips: Pink.     Mouth: Mucous membranes are moist.     Dentition: Normal dentition. No dental tenderness or dental caries.     Tongue: No lesions.     Pharynx: Oropharynx is clear. Uvula midline. No posterior oropharyngeal erythema.  Eyes:     General: Lids are normal. Lids are everted, no foreign bodies appreciated. No scleral icterus.    Extraocular Movements: Extraocular movements intact.     Right eye: No nystagmus.     Left eye: No nystagmus.     Conjunctiva/sclera: Conjunctivae normal.     Pupils: Pupils are equal, round, and reactive to light.  Neck:     Thyroid: No thyroid mass, thyromegaly or thyroid tenderness.     Vascular: No carotid bruit or JVD.  Cardiovascular:     Rate and Rhythm: Normal rate and regular rhythm.     Pulses: Normal pulses.     Heart sounds: Normal heart sounds, S1 normal and S2 normal.  Pulmonary:     Effort: Pulmonary effort is normal.     Breath sounds: Normal breath sounds.  Chest:     Chest wall: No deformity, swelling or tenderness.  Abdominal:     General: Abdomen is flat. Bowel sounds are normal. There is no abdominal bruit.     Palpations: There is no hepatomegaly or splenomegaly.     Tenderness: There is no abdominal tenderness. There is no right CVA tenderness, left CVA tenderness, guarding or rebound.  Genitourinary:    General: Normal vulva.     Exam position: Supine.     Pubic Area: No rash.      Rectum: Normal.     Comments: CHAPERONE PRESENT Musculoskeletal:        General: Normal range of motion.     Right shoulder: Normal.     Left shoulder: Normal.     Cervical back:  Normal, full passive range of motion without pain, normal range of motion and neck supple.     Thoracic back: Normal.     Lumbar back: Normal.     Right lower leg: No edema.     Left lower leg: No edema.  Feet:  Right foot:     Skin integrity: Skin integrity normal.     Left foot:     Skin integrity: Skin integrity normal.  Lymphadenopathy:     Head:     Right side of head: No submental, submandibular or tonsillar adenopathy.     Left side of head: No submental, submandibular or tonsillar adenopathy.     Cervical: No cervical adenopathy.  Skin:    General: Skin is warm and dry.     Capillary Refill: Capillary refill takes less than 2 seconds.     Coloration: Skin is not jaundiced or pale.     Findings: No bruising.  Neurological:     General: No focal deficit present.     Mental Status: She is alert and oriented to person, place, and time.     GCS: GCS eye subscore is 4. GCS verbal subscore is 5. GCS motor subscore is 6.     Gait: Gait normal.  Psychiatric:        Attention and Perception: Attention and perception normal.        Mood and Affect: Mood and affect normal.        Speech: Speech normal.        Behavior: Behavior normal. Behavior is cooperative.        Thought Content: Thought content normal.        Cognition and Memory: Cognition and memory normal.        Judgment: Judgment normal.     BP 128/74   Pulse 80   Temp 97.8 F (36.6 C) (Temporal)   Resp 14   Ht 5' 2.5" (1.588 m)   Wt 174 lb (78.9 kg)   LMP 09/25/2016   SpO2 95%   BMI 31.32 kg/m  Wt Readings from Last 3 Encounters:  04/18/20 174 lb (78.9 kg)  04/15/19 199 lb 6.4 oz (90.4 kg)  06/11/18 192 lb (87.1 kg)     Health Maintenance Due  Topic Date Due  . PAP SMEAR-Modifier  12/06/2019    There are no preventive care reminders to display for this patient.  Lab Results  Component Value Date   TSH 0.76 04/19/2019   Lab Results  Component Value Date   WBC 6.2 04/19/2019   HGB 13.4  04/19/2019   HCT 40.8 04/19/2019   MCV 92.1 04/19/2019   PLT 341 04/19/2019   Lab Results  Component Value Date   NA 138 04/19/2019   K 4.2 04/19/2019   CO2 27 04/19/2019   GLUCOSE 109 (H) 04/19/2019   BUN 12 04/19/2019   CREATININE 0.70 04/19/2019   BILITOT 0.3 04/19/2019   ALKPHOS 111 06/14/2016   AST 26 04/19/2019   ALT 29 04/19/2019   PROT 7.1 04/19/2019   ALBUMIN 4.5 06/14/2016   CALCIUM 9.4 04/19/2019   Lab Results  Component Value Date   CHOL 215 (H) 04/19/2019   Lab Results  Component Value Date   HDL 60 04/19/2019   Lab Results  Component Value Date   LDLCALC 138 (H) 04/19/2019   Lab Results  Component Value Date   TRIG 75 04/19/2019   Lab Results  Component Value Date   CHOLHDL 3.6 04/19/2019   Lab Results  Component Value Date   HGBA1C 6.3 (H) 04/19/2019      Assessment & Plan:   Problem List Items Addressed This Visit      Other   Hyperlipidemia   Relevant Orders   Hemoglobin A1c   COMPLETE METABOLIC PANEL  WITH GFR   CBC with Differential/Platelet   Lipid panel   Microalbumin, urine   Prediabetes   Relevant Orders   Hemoglobin A1c   COMPLETE METABOLIC PANEL WITH GFR   CBC with Differential/Platelet   Lipid panel   Microalbumin, urine   Obesity (BMI 30.0-34.9)   Relevant Orders   Hemoglobin A1c   COMPLETE METABOLIC PANEL WITH GFR   CBC with Differential/Platelet   Lipid panel   Microalbumin, urine    Other Visit Diagnoses    Adult general medical exam    -  Primary   Relevant Orders   Hemoglobin A1c   COMPLETE METABOLIC PANEL WITH GFR   CBC with Differential/Platelet   Lipid panel   Microalbumin, urine   Colon cancer screening       Relevant Orders   Cologuard   Breast cancer screening by mammogram       Relevant Orders   MM Digital Screening   Screening for cervical cancer       Relevant Orders   PAP, Thin Prep w/HPV rflx HPV Type 16/18     Completed yearly general adult examination  Drink plenty of  water  Follow a low fat/low cholesterol, low carbohydrate, low sugar diet  Get at least 20 minutes of exercise 4 times per week  Follow up yearly  Schedule your mammogram  Pap completed today  Hyperlipidemia and prediabetes: Labs completed today will call with results  Cologuard ordered will be mailed to your home for colorectal cancer screening  Performed clinic foot exam  Monthly self breast examination seeking medical attention for abnormal findings.  Foot exam daily at home seek medical attention for break in skin integrity or symptoms of infection such as redness or swelling or non healing breakdown/wound  Blood pressure goal 140/90 or less  No orders of the defined types were placed in this encounter.   Follow-up: Return in about 1 year (around 04/18/2021).    Annie Main, FNP

## 2020-04-19 ENCOUNTER — Encounter: Payer: Self-pay | Admitting: Nurse Practitioner

## 2020-04-19 ENCOUNTER — Other Ambulatory Visit: Payer: Self-pay | Admitting: Nurse Practitioner

## 2020-04-19 DIAGNOSIS — E1169 Type 2 diabetes mellitus with other specified complication: Secondary | ICD-10-CM

## 2020-04-19 DIAGNOSIS — E782 Mixed hyperlipidemia: Secondary | ICD-10-CM

## 2020-04-19 LAB — CBC WITH DIFFERENTIAL/PLATELET
Absolute Monocytes: 475 cells/uL (ref 200–950)
Basophils Absolute: 58 cells/uL (ref 0–200)
Basophils Relative: 0.8 %
Eosinophils Absolute: 124 cells/uL (ref 15–500)
Eosinophils Relative: 1.7 %
HCT: 47.3 % — ABNORMAL HIGH (ref 35.0–45.0)
Hemoglobin: 15.3 g/dL (ref 11.7–15.5)
Lymphs Abs: 2752 cells/uL (ref 850–3900)
MCH: 30.3 pg (ref 27.0–33.0)
MCHC: 32.3 g/dL (ref 32.0–36.0)
MCV: 93.7 fL (ref 80.0–100.0)
MPV: 12.5 fL (ref 7.5–12.5)
Monocytes Relative: 6.5 %
Neutro Abs: 3891 cells/uL (ref 1500–7800)
Neutrophils Relative %: 53.3 %
Platelets: 308 10*3/uL (ref 140–400)
RBC: 5.05 10*6/uL (ref 3.80–5.10)
RDW: 11.7 % (ref 11.0–15.0)
Total Lymphocyte: 37.7 %
WBC: 7.3 10*3/uL (ref 3.8–10.8)

## 2020-04-19 LAB — LIPID PANEL
Cholesterol: 205 mg/dL — ABNORMAL HIGH (ref <200)
HDL: 47 mg/dL — ABNORMAL LOW (ref >=50)
LDL Cholesterol (Calc): 133 mg/dL (calc) — ABNORMAL HIGH
Non-HDL Cholesterol (Calc): 158 mg/dL (calc) — ABNORMAL HIGH (ref <130)
Total CHOL/HDL Ratio: 4.4 (calc) (ref <5.0)
Triglycerides: 137 mg/dL (ref <150)

## 2020-04-19 LAB — COMPLETE METABOLIC PANEL WITH GFR
AG Ratio: 1.4 (calc) (ref 1.0–2.5)
ALT: 40 U/L — ABNORMAL HIGH (ref 6–29)
AST: 25 U/L (ref 10–35)
Albumin: 4.2 g/dL (ref 3.6–5.1)
Alkaline phosphatase (APISO): 172 U/L — ABNORMAL HIGH (ref 37–153)
BUN: 7 mg/dL (ref 7–25)
CO2: 32 mmol/L (ref 20–32)
Calcium: 9.3 mg/dL (ref 8.6–10.4)
Chloride: 98 mmol/L (ref 98–110)
Creat: 0.61 mg/dL (ref 0.50–1.05)
GFR, Est African American: 122 mL/min/{1.73_m2} (ref >=60)
GFR, Est Non African American: 105 mL/min/{1.73_m2} (ref >=60)
Globulin: 3.1 g/dL (calc) (ref 1.9–3.7)
Glucose, Bld: 345 mg/dL — ABNORMAL HIGH (ref 65–99)
Potassium: 3.9 mmol/L (ref 3.5–5.3)
Sodium: 137 mmol/L (ref 135–146)
Total Bilirubin: 0.5 mg/dL (ref 0.2–1.2)
Total Protein: 7.3 g/dL (ref 6.1–8.1)

## 2020-04-19 LAB — HEMOGLOBIN A1C: Hgb A1c MFr Bld: >14 % of total Hgb — ABNORMAL HIGH (ref <5.7)

## 2020-04-19 LAB — MICROALBUMIN, URINE: Microalb, Ur: 1 mg/dL

## 2020-04-19 MED ORDER — INSULIN REGULAR HUMAN 100 UNIT/ML IJ SOLN: INTRAMUSCULAR | 11 refills | Status: DC

## 2020-04-19 MED ORDER — METFORMIN HCL 500 MG PO TABS: 500.0000 mg | ORAL_TABLET | Freq: Two times a day (BID) | ORAL | 3 refills | Status: DC

## 2020-04-19 MED ORDER — ROSUVASTATIN CALCIUM 5 MG PO TABS: 5.0000 mg | ORAL_TABLET | Freq: Every day | ORAL | 3 refills | Status: DC

## 2020-04-20 LAB — PAP, TP IMAGING W/ HPV RNA, RFLX HPV TYPE 16,18/45: HPV DNA High Risk: NOT DETECTED

## 2020-04-20 NOTE — Progress Notes (Signed)
Pap result Negative for intraepithelial lesion or malignancy

## 2020-05-11 ENCOUNTER — Ambulatory Visit
Admission: RE | Admit: 2020-05-11 | Discharge: 2020-05-11 | Disposition: A | Discharge status: Home / Self Care | Payer: BC Managed Care – PPO | Source: Ambulatory Visit | Attending: Nurse Practitioner | Admitting: Nurse Practitioner

## 2020-05-11 ENCOUNTER — Other Ambulatory Visit: Payer: Self-pay

## 2020-05-11 DIAGNOSIS — Z1231 Encounter for screening mammogram for malignant neoplasm of breast: Secondary | ICD-10-CM

## 2020-07-20 ENCOUNTER — Telehealth: Payer: Self-pay

## 2020-07-20 NOTE — Telephone Encounter (Signed)
Pt is calling for results of her lab work that was done a few weeks back.  Pt call back [phone] Baird Lyons) Daughter

## 2020-07-20 NOTE — Telephone Encounter (Signed)
Patient has not had labs recently.   Patient does have appointment next week. Will discuss then.

## 2020-07-25 ENCOUNTER — Other Ambulatory Visit: Payer: Self-pay

## 2020-07-25 ENCOUNTER — Encounter: Payer: Self-pay | Admitting: Family Medicine

## 2020-07-25 ENCOUNTER — Ambulatory Visit (INDEPENDENT_AMBULATORY_CARE_PROVIDER_SITE_OTHER): Payer: BC Managed Care – PPO | Admitting: Family Medicine

## 2020-07-25 VITALS — BP 124/72 | HR 74 | Temp 97.9°F | Resp 14 | Ht 62.5 in | Wt 167.0 lb

## 2020-07-25 DIAGNOSIS — Z23 Encounter for immunization: Secondary | ICD-10-CM

## 2020-07-25 DIAGNOSIS — E669 Obesity, unspecified: Secondary | ICD-10-CM

## 2020-07-25 DIAGNOSIS — E782 Mixed hyperlipidemia: Secondary | ICD-10-CM | POA: Diagnosis not present

## 2020-07-25 DIAGNOSIS — K7581 Nonalcoholic steatohepatitis (NASH): Secondary | ICD-10-CM | POA: Diagnosis not present

## 2020-07-25 DIAGNOSIS — E1169 Type 2 diabetes mellitus with other specified complication: Secondary | ICD-10-CM | POA: Diagnosis not present

## 2020-07-25 NOTE — Assessment & Plan Note (Signed)
UnFortunately patient was not aware that her blood sugar was so extremely elevated.  I reviewed all the labs with her as well as her daughter.  She is open to taking medication including insulin if needed. Check renal function.  We will plan to definitely start Metformin because of her job giving multiple injections a day will be very difficult for her so will need to keep this in mind

## 2020-07-25 NOTE — Assessment & Plan Note (Signed)
Treating with dietary changes Recheck lipids, would benefit from statin

## 2020-07-25 NOTE — Patient Instructions (Signed)
Flu shot given F/U pending results

## 2020-07-25 NOTE — Progress Notes (Signed)
Subjective:    Patient ID: Latoya [name], female    DOB: March 14, 1971, 49 y.o.   MRN: [id number]  Patient presents for Follow-up (is fasting) Interpreter and pt daughter present  Patient here to follow-up chronic medical problems.  This is my first time meeting her.  Medications and history reviewed in detail.  Diabetes mellitus-She is not taking any medications  A1c in July was greater than 14 which is when she was started on Metformin and also given a sliding scale.  Previous PCP alsodiscussed low-carb diet and cutting out sugars. She states she never received any info about needing meds She has lost 30 lbs over the past year unintentionally,daughter states most has occurred in the past 2 months  Note her previous A1c was 6.3% in July 2020 She has not had any other symptoms beisdes mild GI upset while visiting [place] No change in vision, no polyuria, polydipsia   Hyperlipidemia fatty liver noted on imaging in 2019 she is on Crestor 5 mg daily 3 months ago as well.  Her LDL was 133  Mammogram up-to-date Pap smear up-to-date  Immunization she is due for flu shot and pneumonia vaccine   5AM- green juice, 9am- oatmeal with bananas/nuts, 2pm- Lunch- meat/fish /rice/salad - or leftovers, 5PM- Supper Meat/veggies/rice , 8pm green juice   - some soda,    COFFEE inmorning  She has family history of diabetes in parents   2012- had cervical lesion biopsy   Due for eye exam  Review Of Systems:  GEN- denies fatigue, fever, +weight loss,weakness, recent illness HEENT- denies eye drainage, change in vision, nasal discharge, CVS- denies chest pain, palpitations RESP- denies SOB, cough, wheeze ABD- denies N/V, change in stools, abd pain GU- denies dysuria, hematuria, dribbling, incontinence MSK- denies joint pain, muscle aches, injury Neuro- denies headache, dizziness, syncope, seizure activity       Objective:    BP 124/72   Pulse 74   Temp 97.9 F (36.6 C) (Temporal)    Resp 14   Ht 5' 2.5" (1.588 m)   Wt 167 lb (75.8 kg)   LMP 09/25/2016   SpO2 96%   BMI 30.06 kg/m  GEN- NAD, alert and oriented x3 HEENT- PERRL, EOMI, non injected sclera, pink conjunctiva, MMM, oropharynx clear Neck- Supple, no thyromegaly CVS- RRR, no murmur RESP-CTAB ABD-NABS,soft,NT,ND EXT- No edema Pulses- Radial, DP- 2+        Assessment & Plan:      Problem List Items Addressed This Visit      Unprioritized   DM (diabetes mellitus), type 2 ([place]) - Primary    UnFortunately patient was not aware that her blood sugar was so extremely elevated.  I reviewed all the labs with her as well as her daughter.  She is open to taking medication including insulin if needed. Check renal function.  We will plan to definitely start Metformin because of her job giving multiple injections a day will be very difficult for her so will need to keep this in mind       Relevant Orders   CBC with Differential/Platelet   Comprehensive metabolic panel   Hemoglobin A1c   Lipid panel   TSH   Hyperlipidemia   Relevant Orders   Lipid panel   Obesity (BMI 30.0-34.9)    Discussed dietary changes today      Steatohepatitis, nonalcoholic    Treating with dietary changes Recheck lipids, would benefit from statin      Relevant Orders  Comprehensive metabolic panel    Other Visit Diagnoses    Need for immunization against influenza       Relevant Orders   Flu Vaccine QUAD 36+ mos IM (Completed)      Note: This dictation was prepared with Dragon dictation along with smaller phrase technology. Any transcriptional errors that result from this process are unintentional.

## 2020-07-25 NOTE — Assessment & Plan Note (Signed)
Discussed dietary changes today

## 2020-07-26 ENCOUNTER — Other Ambulatory Visit: Payer: Self-pay

## 2020-07-26 DIAGNOSIS — E7800 Pure hypercholesterolemia, unspecified: Secondary | ICD-10-CM

## 2020-07-26 LAB — CBC WITH DIFFERENTIAL/PLATELET
Absolute Monocytes: 377 cells/uL (ref 200–950)
Basophils Absolute: 59 cells/uL (ref 0–200)
Basophils Relative: 0.9 %
Eosinophils Absolute: 91 cells/uL (ref 15–500)
Eosinophils Relative: 1.4 %
HCT: 44.5 % (ref 35.0–45.0)
Hemoglobin: 14.9 g/dL (ref 11.7–15.5)
Lymphs Abs: 2691 cells/uL (ref 850–3900)
MCH: 31 pg (ref 27.0–33.0)
MCHC: 33.5 g/dL (ref 32.0–36.0)
MCV: 92.5 fL (ref 80.0–100.0)
MPV: 11.9 fL (ref 7.5–12.5)
Monocytes Relative: 5.8 %
Neutro Abs: 3283 cells/uL (ref 1500–7800)
Neutrophils Relative %: 50.5 %
Platelets: 370 10*3/uL (ref 140–400)
RBC: 4.81 10*6/uL (ref 3.80–5.10)
RDW: 11.9 % (ref 11.0–15.0)
Total Lymphocyte: 41.4 %
WBC: 6.5 10*3/uL (ref 3.8–10.8)

## 2020-07-26 LAB — LIPID PANEL
Cholesterol: 225 mg/dL — ABNORMAL HIGH (ref <200)
HDL: 55 mg/dL (ref >=50)
LDL Cholesterol (Calc): 147 mg/dL (calc) — ABNORMAL HIGH
Non-HDL Cholesterol (Calc): 170 mg/dL (calc) — ABNORMAL HIGH (ref <130)
Total CHOL/HDL Ratio: 4.1 (calc) (ref <5.0)
Triglycerides: 113 mg/dL (ref <150)

## 2020-07-26 LAB — COMPREHENSIVE METABOLIC PANEL
AG Ratio: 1.5 (calc) (ref 1.0–2.5)
ALT: 27 U/L (ref 6–29)
AST: 18 U/L (ref 10–35)
Albumin: 4.2 g/dL (ref 3.6–5.1)
Alkaline phosphatase (APISO): 150 U/L (ref 37–153)
BUN: 10 mg/dL (ref 7–25)
CO2: 31 mmol/L (ref 20–32)
Calcium: 9.3 mg/dL (ref 8.6–10.4)
Chloride: 99 mmol/L (ref 98–110)
Creat: 0.65 mg/dL (ref 0.50–1.05)
Globulin: 2.8 g/dL (calc) (ref 1.9–3.7)
Glucose, Bld: 311 mg/dL — ABNORMAL HIGH (ref 65–99)
Potassium: 4 mmol/L (ref 3.5–5.3)
Sodium: 138 mmol/L (ref 135–146)
Total Bilirubin: 0.7 mg/dL (ref 0.2–1.2)
Total Protein: 7 g/dL (ref 6.1–8.1)

## 2020-07-26 LAB — TSH: TSH: 0.58 mIU/L

## 2020-07-26 LAB — HEMOGLOBIN A1C
Hgb A1c MFr Bld: 13.2 % of total Hgb — ABNORMAL HIGH (ref <5.7)
Mean Plasma Glucose: 332 (calc)
eAG (mmol/L): 18.4 (calc)

## 2020-07-26 MED ORDER — ATORVASTATIN CALCIUM 10 MG PO TABS: 10.0000 mg | ORAL_TABLET | Freq: Every day | ORAL | 3 refills | Status: DC

## 2020-07-26 MED ORDER — BLOOD GLUCOSE MONITOR KIT: PACK | 0 refills | Status: DC

## 2020-07-26 MED ORDER — METFORMIN HCL 500 MG PO TABS: 500.0000 mg | ORAL_TABLET | Freq: Two times a day (BID) | ORAL | 1 refills | Status: DC

## 2020-07-26 NOTE — Addendum Note (Signed)
Addended by: Vic Blackbird F on: 07/26/2020 07:58 AM   Modules accepted: Orders

## 2020-07-31 ENCOUNTER — Telehealth: Payer: Self-pay | Admitting: Family Medicine

## 2020-07-31 NOTE — Telephone Encounter (Signed)
CB# [phone] Dr.Worthington was suppose  to prescribe a blood sugar machine for pt

## 2020-08-01 NOTE — Telephone Encounter (Signed)
Contacted the pharmacy and did a verbal order for the glucose meter kit

## 2020-08-23 ENCOUNTER — Telehealth: Payer: Self-pay | Admitting: Family Medicine

## 2020-08-23 DIAGNOSIS — E7800 Pure hypercholesterolemia, unspecified: Secondary | ICD-10-CM

## 2020-08-23 MED ORDER — ATORVASTATIN CALCIUM 10 MG PO TABS: 10.0000 mg | ORAL_TABLET | Freq: Every day | ORAL | 3 refills | Status: DC

## 2020-08-23 MED ORDER — BLOOD GLUCOSE TEST VI STRP: ORAL_STRIP | 11 refills | Status: DC

## 2020-08-23 MED ORDER — LANCETS MISC: 11 refills | Status: AC

## 2020-08-23 MED ORDER — METFORMIN HCL 500 MG PO TABS: 500.0000 mg | ORAL_TABLET | Freq: Two times a day (BID) | ORAL | 3 refills | Status: DC

## 2020-08-23 MED ORDER — BLOOD GLUCOSE SYSTEM PAK KIT: PACK | 1 refills | Status: DC

## 2020-08-23 NOTE — Telephone Encounter (Signed)
Call placed to patient daughter.   Again educated on importance of glucose and lipid control.   Prescription sent to pharmacy for medication refills.   Patient reports that her CBG are running around 180- 200 in AM.

## 2020-08-23 NOTE — Telephone Encounter (Addendum)
Daughter Christie Nottingham call would like to know if her mother need to still take the medication that Dr. Buelah Manis prescribe for her in October if so mother need a refill _call back# [phone]

## 2020-08-25 ENCOUNTER — Other Ambulatory Visit: Payer: Self-pay | Admitting: *Deleted

## 2020-08-25 MED ORDER — BLOOD GLUCOSE SYSTEM PAK KIT
PACK | 1 refills | Status: DC
Start: 2020-08-25 — End: 2020-08-30

## 2020-08-25 MED ORDER — BLOOD GLUCOSE TEST VI STRP
ORAL_STRIP | 11 refills | Status: DC
Start: 2020-08-25 — End: 2020-08-30

## 2020-08-30 ENCOUNTER — Telehealth: Payer: Self-pay | Admitting: Family Medicine

## 2020-08-30 DIAGNOSIS — E7800 Pure hypercholesterolemia, unspecified: Secondary | ICD-10-CM

## 2020-08-30 MED ORDER — ATORVASTATIN CALCIUM 10 MG PO TABS: 10.0000 mg | ORAL_TABLET | Freq: Every day | ORAL | 3 refills | Status: DC

## 2020-08-30 MED ORDER — BLOOD GLUCOSE SYSTEM PAK KIT: PACK | 1 refills | Status: AC

## 2020-08-30 MED ORDER — BLOOD GLUCOSE TEST VI STRP: ORAL_STRIP | 11 refills | Status: AC

## 2020-08-30 NOTE — Telephone Encounter (Signed)
Went to pick medication from the pharmacy they no longer have the glucose machine in stock that Dr.[place] prescribe for her can another glucose and strips be order  fax back to the pharmacy also her Cholesterol medication wasn't call in

## 2020-08-30 NOTE — Telephone Encounter (Signed)
Prescription sent to pharmacy.

## 2020-09-19 DIAGNOSIS — E119 Type 2 diabetes mellitus without complications: Secondary | ICD-10-CM | POA: Diagnosis not present

## 2020-09-19 LAB — HM DIABETES EYE EXAM

## 2020-10-30 ENCOUNTER — Encounter: Payer: Self-pay | Admitting: Family Medicine

## 2021-01-09 ENCOUNTER — Telehealth: Payer: Self-pay | Admitting: Family Medicine

## 2021-01-09 NOTE — Telephone Encounter (Signed)
Received call from [facility], daughter of patient, to request medication management: blood sugar level rising. Patient scheduled for appt with Janett Billow for 4/6.

## 2021-01-10 ENCOUNTER — Ambulatory Visit (INDEPENDENT_AMBULATORY_CARE_PROVIDER_SITE_OTHER): Payer: No Typology Code available for payment source | Admitting: Nurse Practitioner

## 2021-01-10 ENCOUNTER — Encounter: Payer: Self-pay | Admitting: Nurse Practitioner

## 2021-01-10 ENCOUNTER — Other Ambulatory Visit: Payer: Self-pay

## 2021-01-10 VITALS — BP 122/72 | HR 70 | Temp 97.7°F | Wt 168.4 lb

## 2021-01-10 DIAGNOSIS — E782 Mixed hyperlipidemia: Secondary | ICD-10-CM

## 2021-01-10 DIAGNOSIS — Z1211 Encounter for screening for malignant neoplasm of colon: Secondary | ICD-10-CM

## 2021-01-10 DIAGNOSIS — Z6830 Body mass index (BMI) 30.0-30.9, adult: Secondary | ICD-10-CM | POA: Diagnosis not present

## 2021-01-10 DIAGNOSIS — E1165 Type 2 diabetes mellitus with hyperglycemia: Secondary | ICD-10-CM

## 2021-01-10 NOTE — Assessment & Plan Note (Signed)
Chronic.  Last A1c in Oct. 2021 was 13.2%.  We will recheck today along with kidney function.  Her blood sugars have consistently been less than 200 fasting, which is encouraging.  Will continue Metformin 500 mg twice daily for now, can plan on increasing this medication if A1c is not below goal of 7%.  Foot and eye exam are up to date.  Urine microalbumin up to date.  She needs pneumococcal vaccine, however we do not have a nurse to administer this today.  She will return for a nurse visit to have this.  Will plan to follow up in 3-6 months depending on what the A1c shows.

## 2021-01-10 NOTE — Patient Instructions (Signed)
F/u 3-6 months pending lab work   https://www.diabeteseducator.org/docs/default-source/living-with-diabetes/conquering-the-grocery-store-v1.pdf?sfvrsn=4">  Recuento de carbohidratos para la diabetes mellitus en los adultos Carbohydrate Counting for Diabetes Mellitus, Adult El recuento de carbohidratos es un mtodo para llevar un registro de la cantidad de carbohidratos que se ingieren. La ingesta natural de carbohidratos aumenta la cantidad de azcar (glucosa) en la sangre. El recuento de la cantidad de carbohidratos que se ingieren mejora el control del nivel de [place], lo que ayuda a Air cabin crew la diabetes. Es importante saber la cantidad de carbohidratos que se pueden ingerir en cada comida sin correr Engineer, manufacturing. Esto es Psychologist, forensic. Un nutricionista puede ayudarlo a crear un plan de alimentacin y a calcular la cantidad de carbohidratos que debe ingerir en cada comida y colacin. Qu alimentos contienen carbohidratos? Los siguientes alimentos incluyen carbohidratos:  Granos, como panes y cereales.  Frijoles secos y productos con soja.  Verduras con almidn, como papas, guisantes y maz.  Lambert Mody y jugos de frutas.  Leche y Estate agent.  Dulces y colaciones, como pasteles, galletas, caramelos, papas fritas de bolsa y refrescos.   Cmo se calculan los carbohidratos de los alimentos? Hay dos maneras de calcular los carbohidratos de los alimentos. Puede leer las etiquetas de los alimentos o aprender cules son los tamaos de las porciones estndar de los alimentos. Puede usar cualquiera de los dos mtodos o [place] combinacin de [place]. Usar la Scientific laboratory technician de informacin nutricional La lista de informacin nutricional est incluida en las etiquetas de casi todas las bebidas y los alimentos envasados de los [place]. Incluye lo siguiente:  El tamao de la porcin.  Informacin sobre los nutrientes de cada porcin, incluidos los gramos (g) de carbohidratos por porcin. Para usar  la informacin nutricional:  Decida cuntas porciones va a comer.  Multiplique la cantidad de porciones por el nmero de carbohidratos por porcin.  El resultado es la cantidad total de carbohidratos que comer. Conocer los tamaos de las porciones estndar de los alimentos Cuando coma alimentos que contengan carbohidratos y que no estn envasados o no incluyan la informacin nutricional en la etiqueta, debe medir las porciones para poder calcular la cantidad de carbohidratos.  Mida los alimentos que comer con una balanza de alimentos o una taza medidora, si es necesario.  Decida cuntas porciones de International aid/development worker.  Multiplique el nmero de porciones por15. En los alimentos que contienen carbohidratos, una porcin [place] a 15 g de carbohidratos. ? Por ejemplo, si come 2 tazas o 10onzas (300g) de fresas, habr comido 2porciones y 30g de carbohidratos (2porciones x 15g=30g).  En el caso de las comidas que contienen mezclas de ms de un alimento, como las sopas y los guisos, debe calcular los carbohidratos de cada alimento que se incluye. La siguiente lista contiene los tamaos de porciones estndar de los alimentos ricos en carbohidratos ms comunes. Cada una de estas porciones tiene aproximadamente 15g de carbohidratos:  1rebanada de pan.  1tortilla de seis pulgadas (15cm).  ? de taza o 2onzas (53g) de arroz o pasta cocidos.   taza o 3 onzas (85 g) de lentejas o frijoles cocidos o enlatados, escurridos y enjuagados.   taza o 3onzas (85g) de verduras con almidn, como guisantes, maz o zapallo.   taza o 4 onzas (120 g) de cereal caliente.   taza o 3 onzas (85 g) de papas hervidas o en pur, o  o 3 onzas (85 g) de una papa grande al horno.   taza o 4 onzas fluidas (162m) de  jugo de frutas.  1 taza u 8 onzas fluidas (237 ml) de leche.  1 unidad pequea o 4 onzas (106 g) de manzana.   unidad o 2 onzas (63 g) de una banana mediana.  1 taza o  5 oz (150 g) de fresas.  3 tazas o 1 oz (24g) de palomitas de maz. Cul sera un ejemplo de recuento de carbohidratos? Para calcular el nmero de carbohidratos de este ejemplo de comida, siga los pasos que se describen a continuacin. Ejemplo de comida  3 onzas (85g) de pechugas de pollo.  ? de taza o 4 onzas (106 g) de arroz integral.   taza o 3 onzas (85 g) de maz.  1 taza u 8 onzas fluidas (237 ml) de leche.  1 taza o 5onzas (150g) de fresas con crema batida sin azcar. Clculo de carbohidratos 1. Identifique los alimentos que contienen carbohidratos: ? Arroz. ? Maz. ? Leche. ? Hughie Closs. 2. Calcule cuntas porciones come de cada alimento: ? 2 porciones de arroz. ? 1 porcin de maz. ? 1 porcin de leche. ? 1 porcin de fresas. 3. Multiplique cada nmero de porciones por 15g: ? 2 porciones de arroz x 15 g = 30 g. ? 1 porcin de maz x 15 g = 15 g. ? 1 porcin de leche x 15 g = 15 g. ? 1 porcin de fresas x 15 g = 15 g. 4. Sume todas las cantidades para conocer el total de gramos de carbohidratos consumidos: ? 30g + 15g + 15g + 15g = 75g de carbohidratos en total. Consejos para seguir este plan Al ir de compras  [place] un plan de comidas y luego haga una lista de compras.  Compre verduras frescas y congeladas, frutas frescas y congeladas, productos lcteos, huevos, frijoles, lentejas y cereales integrales.  Fjese en las etiquetas de los alimentos. Elija los alimentos que tengan ms fibra y Surveyor, minerals.  Evite los alimentos procesados y los alimentos con Nurse, learning disability. Planificacin de las comidas  Trate de consumir la misma cantidad de carbohidratos en cada comida y en cada colacin.  Planifique tomar comidas y colaciones regulares y equilibradas. Dnde buscar ms informacin  American Diabetes Association (Asociacin Estadounidense de la Diabetes): www.diabetes.org  Centers for Disease Control and Prevention (Centros para el Control y la  Prevencin de Arboriculturist): http://www.wolf.info/ Resumen  El recuento de carbohidratos es un mtodo para llevar un registro de la cantidad de carbohidratos que se ingieren.  La ingesta natural de carbohidratos aumenta la cantidad de azcar (glucosa) en la sangre.  El recuento de la cantidad de carbohidratos que se ingieren mejora el control del nivel de [place], lo que ayuda a Air cabin crew la diabetes.  Un nutricionista puede ayudarlo a crear un plan de alimentacin y a calcular la cantidad de carbohidratos que debe ingerir en cada comida y colacin. Esta informacin no tiene Marine scientist el consejo del mdico. Asegrese de hacerle al mdico cualquier pregunta que tenga. Document Revised: 11/01/2019 Document Reviewed: 11/01/2019 Elsevier Patient Education  2021 [employer].

## 2021-01-10 NOTE — Assessment & Plan Note (Signed)
Weight has remained stable since October 2021.  She is exercising using a stationery bike at home and recently joined the gym.  We discussed goal of physical activity is 30 minutes 5 times weekly.  Continue dietary changes with watching intake of carbohydrates and simple sugars.

## 2021-01-10 NOTE — Assessment & Plan Note (Signed)
Chronic.  Started atorvastatin 10 mg in October 2021, will continue this medication for now.  Will recheck lipids today - patient is fasting, with CMP to check liver function.  Encouraged meals revolving around vegetables with lean means.  Follow up in 3-6 months pending blood work results.

## 2021-01-10 NOTE — Progress Notes (Signed)
Subjective:    Patient ID: Latoya [name], female    DOB: Jan 16, 1971, 50 y.o.   MRN: [id number]  HPI: Latoya [name] is a 50 y.o. female presenting with daughter and interpreter for elevated blood sugar.  Chief Complaint  Patient presents with  . Diabetes   DIABETES She has noticed her fasting blood sugars have been elevated for the past few weeks.  She recently started a new job and wakes up earlier for it.  She is wondering if this sleeping pattern can affect her blood sugar.    She is currently taking Metformin 500 mg twice daily with meals for diabetes.  Her A1c in 07/2020 was 13.2%.  She was also started on atorvastatin 10 mg at that time and has been taking this before bed.    She has changed her diet and is eating much less sugar.  She is eating smoothes with strawberries and bananas with almonds.  She used to add vegetables to her smoothies but is not doing that much anymore.    With her new job, she works with her hands all of the time and reports sometimes she has pain in her hands.  She has a stationery bicycle at home that she uses regularly.  She also recently joined the gym.   Hypoglycemic episodes:no Polydipsia/polyuria: no Visual disturbance: no - wears reading glasses Chest pain: no Paresthesias: no Glucose Monitoring: yes  Accucheck frequency: every morning around 5 am.    Fasting glucose: ~180  Taking Insulin?: no Blood Pressure Monitoring: not checking Retinal Examination: Up to Date Foot Exam: Up to Date Diabetic Education: Completed Pneumovax: Not up to Date Influenza: Up to Date Aspirin: no  HYPERLIPIDEMIA Hyperlipidemia status: good compliance Satisfied with current treatment?  yes Side effects:  no Medication compliance: excellent compliance Past cholesterol meds: atorvastatin 10 mg Aspirin:  no The 10-year ASCVD risk score [name], et al., 2013) is: 2.8%   Values used to calculate the score:     Age: 56 years     Sex:  Female     Is Non-Hispanic African American: No     Diabetic: Yes     Tobacco smoker: No     Systolic Blood Pressure: 010 mmHg     Is BP treated: No     HDL Cholesterol: 55 mg/dL     Total Cholesterol: 225 mg/dL Chest pain:  no Coronary artery disease:  no Family history CAD:  no Family history early CAD:  no  Diabetes mellitus: yes  She is wondering if she should have a colonoscopy.  Has never been screened for colon cancer.  No family history of colon cancer known.  She is not having any issues with her bowel movements: no blood in her stool, no changes in caliber of stool.  No Known Allergies  Outpatient Encounter Medications as of 01/10/2021  Medication Sig  . atorvastatin (LIPITOR) 10 MG tablet Take 1 tablet (10 mg total) by mouth daily. TAKE LIPITOR 10 MG AT BEDTIME  . Blood Glucose Monitoring Suppl (BLOOD GLUCOSE SYSTEM PAK) KIT Please dispense based on patient and insurance preference. Use as directed to monitor FSBS 2x daily. Dx: E11.65.  Marland Kitchen Glucose Blood (BLOOD GLUCOSE TEST STRIPS) STRP Please dispense based on patient and insurance preference. Use as directed to monitor FSBS 2x daily. Dx: E11.65.  Marland Kitchen Lancets MISC Please dispense Accu-Chek SoftClix. Use as directed to monitor FSBS 2x daily. Dx: E11.65.  . metFORMIN (GLUCOPHAGE) 500 MG  tablet Take 1 tablet (500 mg total) by mouth 2 (two) times daily with a meal.  . Multiple Vitamins-Minerals (MULTIVITAMIN WITH MINERALS) tablet Take 1 tablet by mouth daily.   No facility-administered encounter medications on file as of 01/10/2021.    Patient Active Problem List   Diagnosis Date Noted  . BMI 30.0-30.9,adult 04/27/2019  . Steatohepatitis, nonalcoholic 76/16/0737  . Elevated LFTs 02/11/2018  . Hyperlipidemia 05/29/2016  . DM (diabetes mellitus), type 2 ([place]) 05/29/2016    Past Medical History:  Diagnosis Date  . Cervical dysplasia, moderate 08/10/2012  . Depression    Pt states situational with health scares with abnormal  Paps, procedures, also breast lump with mammograms and ultrasounds, concerns and depression resolved with the medical problems and she was never medically treated  . DM (diabetes mellitus), type 2 ([place]) 05/29/2016  . History of abnormal cervical Pap smear 2016  . Obesity (BMI 30.0-34.9) 02/04/2018   Discussed diet and exercise, negative net calories to reduce weight.   screening labs ordered,   . Papanicolaou smear of cervix with low grade squamous intraepithelial lesion (LGSIL) 07/01/2012   "Cannot rule out a higher grade lesion" added on pathology report Rec a colpo   . Steatohepatitis, nonalcoholic 10/12/2692   Dx 85/46/27 with RUQ [place] + and mildly elevated LFT's   Relevant past medical, surgical, family and social history reviewed and updated as indicated. Interim medical history since our last visit reviewed.  Review of Systems Per HPI unless specifically indicated above     Objective:    BP 122/72 (BP Location: Left Arm)   Pulse 70   Temp 97.7 F (36.5 C) (Temporal)   Wt 168 lb 6.4 oz (76.4 kg)   LMP 09/25/2016   SpO2 97%   BMI 30.31 kg/m   Wt Readings from Last 3 Encounters:  01/10/21 168 lb 6.4 oz (76.4 kg)  07/25/20 167 lb (75.8 kg)  04/18/20 174 lb (78.9 kg)    Physical Exam Vitals and nursing note reviewed.  Constitutional:      General: She is not in acute distress.    Appearance: Normal appearance. She is not toxic-appearing.  HENT:     Head: Normocephalic and atraumatic.     Right Ear: External ear normal.     Left Ear: External ear normal.  Eyes:     General: No scleral icterus.    Extraocular Movements: Extraocular movements intact.     Pupils: Pupils are equal, round, and reactive to light.  Cardiovascular:     Rate and Rhythm: Normal rate and regular rhythm.     Heart sounds: Normal heart sounds. No murmur heard.   Pulmonary:     Effort: Pulmonary effort is normal. No respiratory distress.     Breath sounds: Normal breath sounds. No wheezing, rhonchi or  rales.  Abdominal:     General: Abdomen is flat.     Palpations: Abdomen is soft.  Musculoskeletal:        General: Normal range of motion.     Cervical back: Normal range of motion and neck supple. No tenderness.     Right lower leg: No edema.     Left lower leg: No edema.  Lymphadenopathy:     Cervical: No cervical adenopathy.  Skin:    General: Skin is warm and dry.     Capillary Refill: Capillary refill takes less than 2 seconds.     Coloration: Skin is not jaundiced or pale.     Findings: No erythema.  Neurological:     Mental Status: She is alert and oriented to person, place, and time.     Motor: No weakness.     Gait: Gait normal.  Psychiatric:        Mood and Affect: Mood normal.        Behavior: Behavior normal.        Thought Content: Thought content normal.        Judgment: Judgment normal.        Assessment & Plan:   Problem List Items Addressed This Visit      Endocrine   DM (diabetes mellitus), type 2 ([place])    Chronic.  Last A1c in Oct. 2021 was 13.2%.  We will recheck today along with kidney function.  Her blood sugars have consistently been less than 200 fasting, which is encouraging.  Will continue Metformin 500 mg twice daily for now, can plan on increasing this medication if A1c is not below goal of 7%.  Foot and eye exam are up to date.  Urine microalbumin up to date.  She needs pneumococcal vaccine, however we do not have a nurse to administer this today.  She will return for a nurse visit to have this.  Will plan to follow up in 3-6 months depending on what the A1c shows.      Relevant Orders   Hemoglobin A1c   COMPLETE METABOLIC PANEL WITH GFR     Other   Hyperlipidemia - Primary    Chronic.  Started atorvastatin 10 mg in October 2021, will continue this medication for now.  Will recheck lipids today - patient is fasting, with CMP to check liver function.  Encouraged meals revolving around vegetables with lean means.  Follow up in 3-6 months pending  blood work results.      Relevant Orders   Lipid Panel   BMI 30.0-30.9,adult    Weight has remained stable since October 2021.  She is exercising using a stationery bike at home and recently joined the gym.  We discussed goal of physical activity is 30 minutes 5 times weekly.  Continue dietary changes with watching intake of carbohydrates and simple sugars.       Other Visit Diagnoses    Screening for colon cancer       Relevant Orders   Ambulatory referral to Gastroenterology       Follow up plan: Return for 3-6 months for diabetes pending blood work.

## 2021-01-11 LAB — COMPLETE METABOLIC PANEL WITH GFR
AG Ratio: 1.6 (calc) (ref 1.0–2.5)
ALT: 31 U/L — ABNORMAL HIGH (ref 6–29)
AST: 17 U/L (ref 10–35)
Albumin: 4.4 g/dL (ref 3.6–5.1)
Alkaline phosphatase (APISO): 136 U/L (ref 37–153)
BUN: 10 mg/dL (ref 7–25)
CO2: 31 mmol/L (ref 20–32)
Calcium: 9.5 mg/dL (ref 8.6–10.4)
Chloride: 103 mmol/L (ref 98–110)
Creat: 0.59 mg/dL (ref 0.50–1.05)
GFR, Est African American: 123 mL/min/{1.73_m2} (ref >=60)
GFR, Est Non African American: 106 mL/min/{1.73_m2} (ref >=60)
Globulin: 2.8 g/dL (calc) (ref 1.9–3.7)
Glucose, Bld: 180 mg/dL — ABNORMAL HIGH (ref 65–99)
Potassium: 4.5 mmol/L (ref 3.5–5.3)
Sodium: 141 mmol/L (ref 135–146)
Total Bilirubin: 0.3 mg/dL (ref 0.2–1.2)
Total Protein: 7.2 g/dL (ref 6.1–8.1)

## 2021-01-11 LAB — LIPID PANEL
Cholesterol: 170 mg/dL (ref <200)
HDL: 45 mg/dL — ABNORMAL LOW (ref >=50)
LDL Cholesterol (Calc): 104 mg/dL (calc) — ABNORMAL HIGH
Non-HDL Cholesterol (Calc): 125 mg/dL (calc) (ref <130)
Total CHOL/HDL Ratio: 3.8 (calc) (ref <5.0)
Triglycerides: 118 mg/dL (ref <150)

## 2021-01-11 LAB — HEMOGLOBIN A1C
Hgb A1c MFr Bld: 8.3 % of total Hgb — ABNORMAL HIGH (ref <5.7)
Mean Plasma Glucose: 192 mg/dL
eAG (mmol/L): 10.6 mmol/L

## 2021-01-12 ENCOUNTER — Other Ambulatory Visit: Payer: Self-pay

## 2021-01-12 ENCOUNTER — Ambulatory Visit (INDEPENDENT_AMBULATORY_CARE_PROVIDER_SITE_OTHER): Payer: No Typology Code available for payment source | Admitting: Nurse Practitioner

## 2021-01-12 ENCOUNTER — Other Ambulatory Visit: Payer: Self-pay | Admitting: *Deleted

## 2021-01-12 DIAGNOSIS — Z23 Encounter for immunization: Secondary | ICD-10-CM

## 2021-01-12 DIAGNOSIS — E7800 Pure hypercholesterolemia, unspecified: Secondary | ICD-10-CM

## 2021-01-12 MED ORDER — METFORMIN HCL 500 MG PO TABS: 1000.0000 mg | ORAL_TABLET | Freq: Two times a day (BID) | ORAL | 3 refills | Status: AC

## 2021-01-12 MED ORDER — ATORVASTATIN CALCIUM 20 MG PO TABS: 20.0000 mg | ORAL_TABLET | Freq: Every day | ORAL | 3 refills | Status: DC

## 2021-04-05 ENCOUNTER — Telehealth: Payer: Self-pay | Admitting: *Deleted

## 2021-04-05 NOTE — Telephone Encounter (Addendum)
Received call from patient daughter, [name]. [phone]~ telephone.   Reports that patient FSBS have been trending up. States that her usual readings are 150- 200 fasting. Reports that her readings this week have increased: 180, 220, 300+. States that fasting readings have been over 300 x2 days.  Reports that she is taking Metformin 1029m PO BID.   Denies weakness, SOB, tachycardia.  Advised to increase water intake to flush system. Advised to avoid  concentrated carbohydrates and sweets.   Appointment scheduled for 04/06/2021.  Please advise.

## 2021-04-05 NOTE — Telephone Encounter (Signed)
It seems odd that this just started happening this week - has she been eating more carbs/sugar or did she start any new medication or steroids?  We can either start night time long acting insulin like Levemir 10 units nightly or glipizide 5 mg twice daily to help lower blood sugar. Would like to see her as planned tomorrow.  With any change in symptoms, needs to seek urgent care immediately.

## 2021-04-05 NOTE — Telephone Encounter (Signed)
Call placed to patient daughter, [name]. [place].

## 2021-04-06 ENCOUNTER — Ambulatory Visit (INDEPENDENT_AMBULATORY_CARE_PROVIDER_SITE_OTHER): Payer: No Typology Code available for payment source | Admitting: Nurse Practitioner

## 2021-04-06 ENCOUNTER — Encounter: Payer: Self-pay | Admitting: Nurse Practitioner

## 2021-04-06 ENCOUNTER — Other Ambulatory Visit: Payer: Self-pay

## 2021-04-06 VITALS — BP 118/78 | HR 73 | Ht 62.5 in | Wt 165.2 lb

## 2021-04-06 DIAGNOSIS — E1165 Type 2 diabetes mellitus with hyperglycemia: Secondary | ICD-10-CM | POA: Diagnosis not present

## 2021-04-06 DIAGNOSIS — M79602 Pain in left arm: Secondary | ICD-10-CM | POA: Diagnosis not present

## 2021-04-06 DIAGNOSIS — R6884 Jaw pain: Secondary | ICD-10-CM

## 2021-04-06 NOTE — Progress Notes (Signed)
Subjective:    Patient ID: Latoya [name], female    DOB: [birth year], 50 y.o.   MRN: [id number]  HPI: Latoya [name] Latoya [name] is a 50 y.o. female presenting for elevated blood sugar.   Chief Complaint  Patient presents with   Blood Sugar Problem    Fasting blood sugar has been very high 298 and 324 for the past 2 days, feels diet has been sensible.   DIABETES Patient reports last week, she was travelling out of state.  She was eating out at a lot of fast food places and stopped working out.  She noticed starting Wednesday, her fasting blood sugar was >200, up to 300.  Normally, her blood sugars are 150-180 fasting.  Hypoglycemic episodes:no Polydipsia/polyuria: no/no Water bottles - 10 per day Visual disturbance: yes - blurred vision far away Chest pain: no Paresthesias: no Glucose Monitoring: yes Accucheck frequency: daily fasting Taking Insulin?: no Blood Pressure Monitoring: not checking Retinal Examination: Up to Date  Yesterday, while driving to work, got panicked because she had pain in her left shoulder.  The pain radiated up to the left side of her face and jaw.  The pain lasted about 20 minutes and then went away - she thinks massaging the shoulder helped.  She did not end up going to work because of the pain.  She is not having any pain today.  She did not have any chest pain or shortness of breath with the arm pain occurred.    Of note, her father has diabetes and had "patches" on heart.  Mom had diabetes, not anymore. Has vascular problems.   No Known Allergies  Outpatient Encounter Medications as of 04/06/2021  Medication Sig   atorvastatin (LIPITOR) 20 MG tablet Take 1 tablet (20 mg total) by mouth daily.   Blood Glucose Monitoring Suppl (BLOOD GLUCOSE SYSTEM PAK) KIT Please dispense based on patient and insurance preference. Use as directed to monitor FSBS 2x daily. Dx: E11.65.   Glucose Blood (BLOOD GLUCOSE TEST STRIPS) STRP Please dispense based on  patient and insurance preference. Use as directed to monitor FSBS 2x daily. Dx: E11.65.   Lancets MISC Please dispense Accu-Chek SoftClix. Use as directed to monitor FSBS 2x daily. Dx: E11.65.   metFORMIN (GLUCOPHAGE) 500 MG tablet Take 2 tablets (1,000 mg total) by mouth 2 (two) times daily with a meal.   Multiple Vitamins-Minerals (MULTIVITAMIN WITH MINERALS) tablet Take 1 tablet by mouth daily.   No facility-administered encounter medications on file as of 04/06/2021.    Patient Active Problem List   Diagnosis Date Noted   BMI 30.0-30.9,adult 04/27/2019   Steatohepatitis, nonalcoholic 02/63/7858   Elevated LFTs 02/11/2018   Hyperlipidemia 05/29/2016   DM (diabetes mellitus), type 2 ([place]) 05/29/2016    Past Medical History:  Diagnosis Date   Cervical dysplasia, moderate 08/10/2012   Depression    Pt states situational with health scares with abnormal Paps, procedures, also breast lump with mammograms and ultrasounds, concerns and depression resolved with the medical problems and she was never medically treated   DM (diabetes mellitus), type 2 ([place]) 05/29/2016   History of abnormal cervical Pap smear 2016   Obesity (BMI 30.0-34.9) 02/04/2018   Discussed diet and exercise, negative net calories to reduce weight.   screening labs ordered,    Papanicolaou smear of cervix with low grade squamous intraepithelial lesion (LGSIL) 07/01/2012   "Cannot rule out a higher grade lesion" added on pathology report Rec a colpo  Steatohepatitis, nonalcoholic 9/56/2130   Dx 86/57/84 with RUQ [place] + and mildly elevated LFT's    Relevant past medical, surgical, family and social history reviewed and updated as indicated. Interim medical history since our last visit reviewed.  Review of Systems Per HPI unless specifically indicated above     Objective:    BP 118/78   Pulse 73   Ht 5' 2.5" (1.588 m)   Wt 165 lb 3.2 oz (74.9 kg)   LMP 09/25/2016   SpO2 98%   BMI 29.73 kg/m   Wt Readings from Last  3 Encounters:  04/06/21 165 lb 3.2 oz (74.9 kg)  01/10/21 168 lb 6.4 oz (76.4 kg)  07/25/20 167 lb (75.8 kg)    Physical Exam Vitals and nursing note reviewed.  Constitutional:      General: She is not in acute distress.    Appearance: Normal appearance. She is not toxic-appearing.  HENT:     Head: Normocephalic and atraumatic.  Eyes:     General: No scleral icterus.    Extraocular Movements: Extraocular movements intact.  Cardiovascular:     Rate and Rhythm: Normal rate and regular rhythm.     Heart sounds: Normal heart sounds. No murmur heard. Pulmonary:     Effort: Pulmonary effort is normal. No respiratory distress.     Breath sounds: Normal breath sounds. No wheezing, rhonchi or rales.  Musculoskeletal:        General: Normal range of motion.     Cervical back: Normal range of motion.     Right lower leg: No edema.     Left lower leg: No edema.  Lymphadenopathy:     Cervical: No cervical adenopathy.  Skin:    General: Skin is warm and dry.     Coloration: Skin is not jaundiced or pale.     Findings: No erythema.  Neurological:     Mental Status: She is alert and oriented to person, place, and time.     Motor: No weakness.     Gait: Gait normal.  Psychiatric:        Mood and Affect: Mood normal.        Behavior: Behavior normal.        Thought Content: Thought content normal.        Judgment: Judgment normal.      Assessment & Plan:  1. Type 2 diabetes mellitus with hyperglycemia, without long-term current use of insulin (HCC) Chronic.  Recent elevated blood glucose levels likely multifactorial - stress, not preparing food at home, not working out as normal.  Encouraged reinstating these healthier habits and blood sugar will likely improve and return to baseline.  Will plan to recheck next week as previously scheduled.    2. Pain of left upper extremity Acute, with radiation to left jaw.  Significant risk factors for CAD including diabetes and family history of  CAD.  EKG today in clinic read within normal limits, however query if V2 lead shows slight ST segment abnormalities.  Discussed with patient - will start aspirin 81 mg and place urgent referral to Cardiology to be seen next week hopefully.  Strongly advised patient to seek emergent care if symptoms recur over the weekend.  - EKG 12-Lead - Ambulatory referral to Cardiology  3. Jaw pain Significant risk factors for CAD including diabetes and family history of CAD.  EKG today in clinic read within normal limits, however query if V2 lead shows slight ST segment abnormalities.  Discussed with patient -  will starting aspirin 81 mg and place urgent referral to Cardiology to be seen next week hopefully.  Strongly advised patient to seek emergent care if symptoms recur over the weekend.    Follow up plan: Return for as scheduled.

## 2021-04-09 NOTE — Progress Notes (Signed)
Cardiology Office Note   Date:  04/10/2021   ID:  Latoya [name], DOB June 05, 1971, MRN [id number]  PCP:  Latoya Bear, NP    No chief complaint on file.  Arm pain, RF for CAD  Wt Readings from Last 3 Encounters:  04/10/21 168 lb 9.6 oz (76.5 kg)  04/06/21 165 lb 3.2 oz (74.9 kg)  01/10/21 168 lb 6.4 oz (76.4 kg)       History of Present Illness: Latoya [name] is a 50 y.o. female who is being seen today for the evaluation of arm pain,  Multiple RF for CAD, at the request of Latoya Bear, NP.   She was seen on 7/1 with arm pain radiating to jaw. "Significant risk factors for CAD including diabetes and family history of CAD.  EKG today in clinic read within normal limits, however query if V2 lead shows slight ST segment abnormalities.  Discussed with patient - will starting aspirin 81 mg and place urgent referral to Cardiology to be seen next week hopefully.  Strongly advised patient to seek emergent care if symptoms recur over the weekend."  Above Pain persisted for a few minutes.   Walking is most strenuous activity and has not triggered the pain.  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    Denies : Dizziness. Leg edema. Nitroglycerin use. Orthopnea. Palpitations. Paroxysmal nocturnal dyspnea. Shortness of breath. Syncope.    Past Medical History:  Diagnosis Date   Cervical dysplasia, moderate 08/10/2012   Depression    Pt states situational with health scares with abnormal Paps, procedures, also breast lump with mammograms and ultrasounds, concerns and depression resolved with the medical problems and she was never medically treated   DM (diabetes mellitus), type 2 ([place]) 05/29/2016   History of abnormal cervical Pap smear 2016   Obesity (BMI 30.0-34.9) 02/04/2018   Discussed diet and exercise, negative net calories to reduce weight.   screening labs ordered,    Papanicolaou smear of cervix with low grade squamous intraepithelial lesion (LGSIL)  07/01/2012   "Cannot rule out a higher grade lesion" added on pathology report Rec a colpo    Steatohepatitis, nonalcoholic 11/26/2540   Dx 70/62/37 with RUQ [place] + and mildly elevated LFT's    Past Surgical History:  Procedure Laterality Date   CERVICAL BIOPSY  W/ LOOP ELECTRODE EXCISION     CESAREAN SECTION     one previous   TONSILLECTOMY AND ADENOIDECTOMY  50 yo     Current Outpatient Medications  Medication Sig Dispense Refill   atorvastatin (LIPITOR) 20 MG tablet Take 1 tablet (20 mg total) by mouth daily. 90 tablet 3   Blood Glucose Monitoring Suppl (BLOOD GLUCOSE SYSTEM PAK) KIT Please dispense based on patient and insurance preference. Use as directed to monitor FSBS 2x daily. Dx: E11.65. 1 kit 1   Glucose Blood (BLOOD GLUCOSE TEST STRIPS) STRP Please dispense based on patient and insurance preference. Use as directed to monitor FSBS 2x daily. Dx: E11.65. 100 strip 11   Lancets MISC Please dispense Accu-Chek SoftClix. Use as directed to monitor FSBS 2x daily. Dx: E11.65. 100 each 11   metFORMIN (GLUCOPHAGE) 500 MG tablet Take 2 tablets (1,000 mg total) by mouth 2 (two) times daily with a meal. 360 tablet 3   Multiple Vitamins-Minerals (MULTIVITAMIN WITH MINERALS) tablet Take 1 tablet by mouth daily. 90 tablet 3   No current facility-administered medications for this visit.    Allergies:  Patient has no known allergies.    Social History:  The patient  reports that she has never smoked. She has never used smokeless tobacco. She reports current alcohol use. She reports that she does not use drugs.   Family History:  The patient's family history includes Cancer in her father and mother; Diabetes in her father, mother, sister, sister, and sister; Heart disease in her father; Hypertension in her father and mother.    ROS:  Please see the history of present illness.   Otherwise, review of systems are positive for arm pain and neck pain.   All other systems are reviewed and  negative.    PHYSICAL EXAM: VS:  BP 128/74   Pulse 75   Ht 5' 2.5" (1.588 m)   Wt 168 lb 9.6 oz (76.5 kg)   LMP 09/25/2016   SpO2 97%   BMI 30.35 kg/m  , BMI Body mass index is 30.35 kg/m. GEN: Well nourished, well developed, in no acute distress HEENT: normal Neck: no JVD, carotid bruits, or masses Cardiac: RRR; no murmurs, rubs, or gallops,no edema  Respiratory:  clear to auscultation bilaterally, normal work of breathing GI: soft, nontender, nondistended, + BS MS: no deformity or atrophy Skin: warm and dry, no rash Neuro:  Strength and sensation are intact Psych: euthymic mood, full affect   EKG:   The ekg ordered today demonstrates NSR, early repol on July 1   Recent Labs: 07/25/2020: Hemoglobin 14.9; Platelets 370; TSH 0.58 01/10/2021: ALT 31; BUN 10; Creat 0.59; Potassium 4.5; Sodium 141   Lipid Panel    Component Value Date/Time   CHOL 170 01/10/2021 0842   CHOL 190 06/14/2016 0851   TRIG 118 01/10/2021 0842   TRIG 131 01/04/2016 1015   HDL 45 (L) 01/10/2021 0842   HDL 57 06/14/2016 0851   CHOLHDL 3.8 01/10/2021 0842   LDLCALC 104 (H) 01/10/2021 0842     Other studies Reviewed: Additional studies/ records that were reviewed today with results demonstrating: normal renal function.   ASSESSMENT AND PLAN:  Arm/jaw pain: Some typical and atypical features.  Given RF for CAD, will plan for CTA coronaries to eval for obstructive CAD.  Metoprolol 100 mg x 1 prior to scan. DM: A1C 8.3.  Poorly controlled.  High fiber, whole fod plant based diet.  Folowed by PMD.  Hyperlipidemia: Discussed Increase atorvastatin to 40 mg daily, last LDL 104.  To be rechecked.  If LDL above 100, would increase to 40 mg daily.  Abnormal ECG:  Mild ST changes, but likely early repol.    Current medicines are reviewed at length with the patient today.  The patient concerns regarding her medicines were addressed.  The following changes have been made:  No change  Labs/ tests  ordered today include:  No orders of the defined types were placed in this encounter.   Recommend 150 minutes/week of aerobic exercise Low fat, low carb, high fiber diet recommended  Disposition:   FU for CTA   Signed, Larae Grooms, MD  04/10/2021 11:54 AM    [facility] [place], [place], [place]  [address] Phone: [phone]; Fax: [phone]

## 2021-04-10 ENCOUNTER — Encounter: Payer: Self-pay | Admitting: Interventional Cardiology

## 2021-04-10 ENCOUNTER — Ambulatory Visit (INDEPENDENT_AMBULATORY_CARE_PROVIDER_SITE_OTHER): Payer: PRIVATE HEALTH INSURANCE | Admitting: Interventional Cardiology

## 2021-04-10 ENCOUNTER — Other Ambulatory Visit: Payer: Self-pay

## 2021-04-10 VITALS — BP 128/74 | HR 75 | Ht 62.5 in | Wt 168.6 lb

## 2021-04-10 DIAGNOSIS — R072 Precordial pain: Secondary | ICD-10-CM

## 2021-04-10 DIAGNOSIS — R9431 Abnormal electrocardiogram [ECG] [EKG]: Secondary | ICD-10-CM

## 2021-04-10 DIAGNOSIS — E782 Mixed hyperlipidemia: Secondary | ICD-10-CM

## 2021-04-10 DIAGNOSIS — E119 Type 2 diabetes mellitus without complications: Secondary | ICD-10-CM | POA: Diagnosis not present

## 2021-04-10 LAB — BASIC METABOLIC PANEL
BUN/Creatinine Ratio: 10 (ref 9–23)
BUN: 7 mg/dL (ref 6–24)
CO2: 30 mmol/L — ABNORMAL HIGH (ref 20–29)
Calcium: 9.9 mg/dL (ref 8.7–10.2)
Chloride: 97 mmol/L (ref 96–106)
Creatinine, Ser: 0.68 mg/dL (ref 0.57–1.00)
Glucose: 275 mg/dL — ABNORMAL HIGH (ref 65–99)
Potassium: 4.4 mmol/L (ref 3.5–5.2)
Sodium: 137 mmol/L (ref 134–144)
eGFR: 105 mL/min/{1.73_m2} (ref >59)

## 2021-04-10 MED ORDER — METOPROLOL TARTRATE 100 MG PO TABS: ORAL_TABLET | ORAL | 0 refills | Status: AC

## 2021-04-10 NOTE — Telephone Encounter (Signed)
Patient seen in office on 04/06/2021.  Message to be closed.

## 2021-04-10 NOTE — Patient Instructions (Addendum)
Medication Instructions:  Your physician recommends that you continue on your current medications as directed. Please refer to the Current Medication list given to you today.  *If you need a refill on your cardiac medications before your next appointment, please call your pharmacy*   Lab Work: Lab work to be done today--BMP If you have labs (blood work) drawn today and your tests are completely normal, you will receive your results only by: [place] (if you have MyChart) OR A paper copy in the mail If you have any lab test that is abnormal or we need to change your treatment, we will call you to review the results.   Testing/Procedures: Your physician has requested that you have cardiac CT. Cardiac computed tomography (CT) is a painless test that uses an x-ray machine to take clear, detailed pictures of your heart. For further information please visit [email or web address]. Please follow instruction sheet as given.     Follow-Up: At [facility], you and your health needs are our priority.  As part of our continuing mission to provide you with exceptional heart care, we have created designated Provider Care Teams.  These Care Teams include your primary Cardiologist (physician) and Advanced Practice Providers (APPs -  Physician Assistants and Nurse Practitioners) who all work together to provide you with the care you need, when you need it.  We recommend signing up for the patient portal called "MyChart".  Sign up information is provided on this After Visit Summary.  MyChart is used to connect with patients for Virtual Visits (Telemedicine).  Patients are able to view lab/test results, encounter notes, upcoming appointments, etc.  Non-urgent messages can be sent to your provider as well.   To learn more about what you can do with MyChart, go to [email or web address].    Your next appointment:   Based on CT results  The format for your next appointment:   In Person  Provider:    You may see Larae Grooms, MD or one of the following Advanced Practice Providers on your designated Care Team:   Melina Copa, PA-C Ermalinda Barrios, PA-C   Other Instructions    Your cardiac CT will be scheduled at one of the below locations:   [facility] [address] [place], [place] [address] [phone]  [facility] [address] [place], [place] [address] [phone]  If scheduled at [facility], please arrive at the [facility] main entrance (entrance A) of [facility] 30 minutes prior to test start time. Proceed to the [facility] Radiology Department (first floor) to check-in and test prep.  If scheduled at [facility], please arrive 15 mins early for check-in and test prep.  Please follow these instructions carefully (unless otherwise directed):    On the Night Before the Test: Be sure to Drink plenty of water. Do not consume any caffeinated/decaffeinated beverages or chocolate 12 hours prior to your test. Do not take any antihistamines 12 hours prior to your test.  On the Day of the Test: Drink plenty of water until 1 hour prior to the test. Do not eat any food 4 hours prior to the test. You may take your regular medications prior to the test.  Take metoprolol (Lopressor) two hours prior to test. Dose is 100 mg HOLD Furosemide/Hydrochlorothiazide morning of the test. FEMALES- please wear underwire-free bra if available        After the Test: Drink plenty of water. After  receiving IV contrast, you may experience a mild flushed feeling. This is normal. On occasion, you may experience a mild rash up to 24 hours after the test. This is not dangerous. If this occurs, you can take Benadryl 25 mg and increase your fluid intake. If you experience trouble breathing, this can be serious. If it is severe call 911 IMMEDIATELY. If it is mild, please call our  office. If you take any of these medications: Glipizide/Metformin, Avandament, Glucavance, please do not take 48 hours after completing test unless otherwise instructed.   Once we have confirmed authorization from your insurance company, we will call you to set up a date and time for your test. Based on how quickly your insurance processes prior authorizations requests, please allow up to 4 weeks to be contacted for scheduling your Cardiac CT appointment. Be advised that routine Cardiac CT appointments could be scheduled as many as 8 weeks after your provider has ordered it.  For non-scheduling related questions, please contact the cardiac imaging nurse navigator should you have any questions/concerns: Marchia Bond, Cardiac Imaging Nurse Navigator Gordy Clement, Cardiac Imaging Nurse Navigator [facility] Direct Office Dial: [phone]   For scheduling needs, including cancellations and rescheduling, please call [place], [phone].

## 2021-04-14 NOTE — Progress Notes (Signed)
Subjective:    Patient ID: Latoya [name], female    DOB: 20-Oct-1970, 50 y.o.   MRN: [id number]  HPI: Latoya [name] is a 50 y.o. female presenting with interpreter, Rodman Key for follow up.  Chief Complaint  Patient presents with   Follow-up    DIABETES Hypoglycemic episodes:no Polydipsia/polyuria: no Visual disturbance: no Chest pain: no Paresthesias: no Glucose Monitoring: yes  Accucheck frequency: daily fasting  Fasting glucose: 182 today Taking Insulin?: no Blood Pressure Monitoring: not checking Retinal Examination: Up to Date Foot Exam: Up to Date Diabetic Education: Completed Pneumovax: Up to Date Influenza: Up to Date Aspirin: yes  Asks if she can use coconut oil on her body.   Asks about oatmeal.  If she can continue to add this to her shakes.   HYPERLIPIDEMIA Currently taking atorvastatin 20 mg daily for high cholesterol and also history of diabetes . Tolerating this medication well.  Hyperlipidemia status: stable Satisfied with current treatment?  yes Side effects:  no Medication compliance: excellent compliance Aspirin:  no The 10-year ASCVD risk score [name], et al., 2013) is: 2.7%   Values used to calculate the score:     Age: 72 years     Sex: Female     Is Non-Hispanic African American: No     Diabetic: Yes     Tobacco smoker: No     Systolic Blood Pressure: 409 mmHg     Is BP treated: No     HDL Cholesterol: 45 mg/dL     Total Cholesterol: 170 mg/dL Chest pain:  no Coronary artery disease:  following with Cardiology to determine Family history CAD:  yes Family history early CAD:  yes  No Known Allergies  Outpatient Encounter Medications as of 04/16/2021  Medication Sig   Specialty Vitamins Products (ADVANCED COLLAGEN PO) Take by mouth.   vitamin B-12 (CYANOCOBALAMIN) 100 MCG tablet Take 100 mcg by mouth daily.   [EXPIRED] Zoster Vaccine Adjuvanted [facility]) injection Inject 0.5 mLs into the muscle once for 1  dose. Administer second dose 2-6 months after first dose.   atorvastatin (LIPITOR) 20 MG tablet Take 1 tablet (20 mg total) by mouth daily.   Blood Glucose Monitoring Suppl (BLOOD GLUCOSE SYSTEM PAK) KIT Please dispense based on patient and insurance preference. Use as directed to monitor FSBS 2x daily. Dx: E11.65.   Glucose Blood (BLOOD GLUCOSE TEST STRIPS) STRP Please dispense based on patient and insurance preference. Use as directed to monitor FSBS 2x daily. Dx: E11.65.   Lancets MISC Please dispense Accu-Chek SoftClix. Use as directed to monitor FSBS 2x daily. Dx: E11.65.   metFORMIN (GLUCOPHAGE) 500 MG tablet Take 2 tablets (1,000 mg total) by mouth 2 (two) times daily with a meal.   metoprolol tartrate (LOPRESSOR) 100 MG tablet Take one tablet by mouth 2 hours prior to CT Scan   Multiple Vitamins-Minerals (MULTIVITAMIN WITH MINERALS) tablet Take 1 tablet by mouth daily.   No facility-administered encounter medications on file as of 04/16/2021.    Patient Active Problem List   Diagnosis Date Noted   BMI 30.0-30.9,adult 04/27/2019   Steatohepatitis, nonalcoholic 73/53/2992   Elevated LFTs 02/11/2018   Hyperlipidemia associated with type 2 diabetes mellitus ([place]) 05/29/2016   Type 2 diabetes mellitus with hyperglycemia ([place]) 05/29/2016    Past Medical History:  Diagnosis Date   Cervical dysplasia, moderate 08/10/2012   Depression    Pt states situational with health scares with abnormal Paps, procedures, also breast lump with  mammograms and ultrasounds, concerns and depression resolved with the medical problems and she was never medically treated   DM (diabetes mellitus), type 2 ([place]) 05/29/2016   History of abnormal cervical Pap smear 2016   Obesity (BMI 30.0-34.9) 02/04/2018   Discussed diet and exercise, negative net calories to reduce weight.   screening labs ordered,    Papanicolaou smear of cervix with low grade squamous intraepithelial lesion (LGSIL) 07/01/2012   "Cannot rule out  a higher grade lesion" added on pathology report Rec a colpo    Steatohepatitis, nonalcoholic 6/59/9357   Dx 01/77/93 with RUQ [place] + and mildly elevated LFT's    Relevant past medical, surgical, family and social history reviewed and updated as indicated. Interim medical history since our last visit reviewed.  Review of Systems Per HPI unless specifically indicated above     Objective:    BP 122/80 (BP Location: Left Arm, Patient Position: Sitting, Cuff Size: Normal)   Pulse 68   LMP 09/25/2016   SpO2 98%   Wt Readings from Last 3 Encounters:  04/10/21 168 lb 9.6 oz (76.5 kg)  04/06/21 165 lb 3.2 oz (74.9 kg)  01/10/21 168 lb 6.4 oz (76.4 kg)    Physical Exam Vitals and nursing note reviewed.  Constitutional:      General: She is not in acute distress.    Appearance: Normal appearance. She is not toxic-appearing.  HENT:     Head: Normocephalic and atraumatic.  Eyes:     General: No scleral icterus.    Extraocular Movements: Extraocular movements intact.     Pupils: Pupils are equal, round, and reactive to light.  Cardiovascular:     Rate and Rhythm: Normal rate and regular rhythm.     Heart sounds: Normal heart sounds. No murmur heard. Pulmonary:     Effort: Pulmonary effort is normal. No respiratory distress.     Breath sounds: No wheezing, rhonchi or rales.  Abdominal:     General: Abdomen is flat. Bowel sounds are normal.     Palpations: Abdomen is soft.  Musculoskeletal:     Cervical back: Normal range of motion.  Lymphadenopathy:     Cervical: No cervical adenopathy.  Skin:    General: Skin is warm and dry.     Capillary Refill: Capillary refill takes less than 2 seconds.     Coloration: Skin is not jaundiced or pale.     Findings: No erythema.  Neurological:     Mental Status: She is alert and oriented to person, place, and time.     Motor: No weakness.     Gait: Gait normal.  Psychiatric:        Mood and Affect: Mood normal.        Behavior: Behavior  normal.        Thought Content: Thought content normal.        Judgment: Judgment normal.      Assessment & Plan:   Problem List Items Addressed This Visit       Endocrine   Type 2 diabetes mellitus with hyperglycemia ([place]) - Primary    Chronic, improving since last week. A1c and CMP checked today. Expect A1c may be slightly elevated from last check.  A1c goal is less than 7%.  If elevated, can increase Metformin to 1000 mg twice daily or add in glipizide.  Discussed diet at length today - vegetables and lean proteins are best and then can include some fruit and complex carbohydrates like oatmeal.  Declines  referral to dietician, although can consider for future.  Continue physical activity - goal is 30 minutes 5 times weekly.  Foot exam and eye exam are up to date.  Urine microalbumin is up to date.  Patient is on a statin.  Follow up in 3 months.        Relevant Orders   Hemoglobin A1c (Completed)   COMPLETE METABOLIC PANEL WITH GFR   Hyperlipidemia associated with type 2 diabetes mellitus (H[facility])    Chronic.  Will check lipids today - patient is fasting.  LDL goal is less than 70 given history of diabetes, will plan to increase atorvastatin if needed.  Follow up in 3-6 months pending lab work.        Relevant Orders   COMPLETE METABOLIC PANEL WITH GFR   Lipid panel     Other   BMI 30.0-30.9,adult    Discussed dietary and lifestyle changes including increasing intake of vegetables and lean proteins and limiting intake of complex carbohydrates.  Avoid all simple sugars.  Goal for physical activity is 30 minutes 5 times weekly.  Follow up in 3 months.        Other Visit Diagnoses     Encounter for screening mammogram for malignant neoplasm of breast       Relevant Orders   MM Digital Diagnostic Bilat   Need for shingles vaccine            Follow up plan: Return for 3 months DM.

## 2021-04-16 ENCOUNTER — Encounter: Payer: Self-pay | Admitting: Nurse Practitioner

## 2021-04-16 ENCOUNTER — Other Ambulatory Visit: Payer: Self-pay

## 2021-04-16 ENCOUNTER — Ambulatory Visit (INDEPENDENT_AMBULATORY_CARE_PROVIDER_SITE_OTHER): Payer: No Typology Code available for payment source | Admitting: Nurse Practitioner

## 2021-04-16 VITALS — BP 122/80 | HR 68

## 2021-04-16 DIAGNOSIS — Z6830 Body mass index (BMI) 30.0-30.9, adult: Secondary | ICD-10-CM

## 2021-04-16 DIAGNOSIS — E1169 Type 2 diabetes mellitus with other specified complication: Secondary | ICD-10-CM | POA: Diagnosis not present

## 2021-04-16 DIAGNOSIS — E782 Mixed hyperlipidemia: Secondary | ICD-10-CM

## 2021-04-16 DIAGNOSIS — E785 Hyperlipidemia, unspecified: Secondary | ICD-10-CM | POA: Diagnosis not present

## 2021-04-16 DIAGNOSIS — Z1231 Encounter for screening mammogram for malignant neoplasm of breast: Secondary | ICD-10-CM

## 2021-04-16 DIAGNOSIS — Z23 Encounter for immunization: Secondary | ICD-10-CM

## 2021-04-16 DIAGNOSIS — E1165 Type 2 diabetes mellitus with hyperglycemia: Secondary | ICD-10-CM | POA: Diagnosis not present

## 2021-04-16 MED ORDER — SHINGRIX 50 MCG/0.5ML IM SUSR: 0.5000 mL | Freq: Once | INTRAMUSCULAR | 1 refills | Status: AC

## 2021-04-17 NOTE — Assessment & Plan Note (Signed)
Discussed dietary and lifestyle changes including increasing intake of vegetables and lean proteins and limiting intake of complex carbohydrates.  Avoid all simple sugars.  Goal for physical activity is 30 minutes 5 times weekly.  Follow up in 3 months.

## 2021-04-17 NOTE — Assessment & Plan Note (Addendum)
Chronic, improving since last week. A1c and CMP checked today. Expect A1c may be slightly elevated from last check.  A1c goal is less than 7%.  If elevated, can increase Metformin to 1000 mg twice daily or add in glipizide.  Discussed diet at length today - vegetables and lean proteins are best and then can include some fruit and complex carbohydrates like oatmeal.  Declines referral to dietician, although can consider for future.  Continue physical activity - goal is 30 minutes 5 times weekly.  Foot exam and eye exam are up to date.  Urine microalbumin is up to date.  Patient is on a statin.  Follow up in 3 months.

## 2021-04-17 NOTE — Assessment & Plan Note (Signed)
Chronic.  Will check lipids today - patient is fasting.  LDL goal is less than 70 given history of diabetes, will plan to increase atorvastatin if needed.  Follow up in 3-6 months pending lab work.

## 2021-04-18 LAB — COMPLETE METABOLIC PANEL WITH GFR
AG Ratio: 1.6 (calc) (ref 1.0–2.5)
ALT: 29 U/L (ref 6–29)
AST: 19 U/L (ref 10–35)
Albumin: 4.3 g/dL (ref 3.6–5.1)
Alkaline phosphatase (APISO): 114 U/L (ref 37–153)
BUN: 8 mg/dL (ref 7–25)
CO2: 25 mmol/L (ref 20–32)
Calcium: 8.9 mg/dL (ref 8.6–10.4)
Chloride: 105 mmol/L (ref 98–110)
Creat: 0.59 mg/dL (ref 0.50–1.03)
Globulin: 2.7 g/dL (calc) (ref 1.9–3.7)
Glucose, Bld: 215 mg/dL — ABNORMAL HIGH (ref 65–99)
Potassium: 4.1 mmol/L (ref 3.5–5.3)
Sodium: 140 mmol/L (ref 135–146)
Total Bilirubin: 0.4 mg/dL (ref 0.2–1.2)
Total Protein: 7 g/dL (ref 6.1–8.1)
eGFR: 108 mL/min/{1.73_m2} (ref >=60)

## 2021-04-18 LAB — LIPID PANEL
Cholesterol: 116 mg/dL (ref <200)
HDL: 42 mg/dL — ABNORMAL LOW (ref >=50)
LDL Cholesterol (Calc): 56 mg/dL (calc)
Non-HDL Cholesterol (Calc): 74 mg/dL (calc) (ref <130)
Total CHOL/HDL Ratio: 2.8 (calc) (ref <5.0)
Triglycerides: 94 mg/dL (ref <150)

## 2021-04-18 LAB — HEMOGLOBIN A1C
Hgb A1c MFr Bld: 10.1 % of total Hgb — ABNORMAL HIGH (ref <5.7)
Mean Plasma Glucose: 243 mg/dL
eAG (mmol/L): 13.5 mmol/L

## 2021-04-18 LAB — SPECIMEN COMPROMISED

## 2021-04-19 ENCOUNTER — Encounter: Payer: Self-pay | Admitting: Nurse Practitioner

## 2021-04-19 ENCOUNTER — Other Ambulatory Visit: Payer: Self-pay

## 2021-04-19 DIAGNOSIS — E1165 Type 2 diabetes mellitus with hyperglycemia: Secondary | ICD-10-CM

## 2021-04-19 MED ORDER — GLIPIZIDE 5 MG PO TABS: 5.0000 mg | ORAL_TABLET | Freq: Every day | ORAL | 1 refills | Status: AC

## 2021-04-19 NOTE — Progress Notes (Signed)
Left message with daughter for pt to call bk for results. Pt will have translator available

## 2021-04-23 ENCOUNTER — Encounter: Payer: BC Managed Care – PPO | Admitting: Nurse Practitioner

## 2021-05-10 ENCOUNTER — Ambulatory Visit (HOSPITAL_COMMUNITY): Payer: PRIVATE HEALTH INSURANCE

## 2021-06-06 ENCOUNTER — Other Ambulatory Visit (HOSPITAL_COMMUNITY): Payer: Self-pay | Admitting: Emergency Medicine

## 2021-06-06 ENCOUNTER — Telehealth (HOSPITAL_COMMUNITY): Payer: Self-pay | Admitting: Emergency Medicine

## 2021-06-06 DIAGNOSIS — Z01812 Encounter for preprocedural laboratory examination: Secondary | ICD-10-CM

## 2021-06-06 NOTE — Telephone Encounter (Signed)
Calling patient to request labs (BMP) prior to CCTA on Tuesday. [place] RN Navigator Cardiac Imaging [facility] [phone] Office  [phone] Cell

## 2021-06-07 ENCOUNTER — Telehealth (HOSPITAL_COMMUNITY): Payer: Self-pay | Admitting: Emergency Medicine

## 2021-06-07 NOTE — Telephone Encounter (Signed)
Attempted to call patient regarding upcoming cardiac CT appointment. Left message on voicemail with name and callback number Marchia Bond RN Navigator Cardiac Imaging [facility] [phone] Office [phone] Cell  Courtesy call to remind her to get labs today or tomorrow

## 2021-06-12 ENCOUNTER — Ambulatory Visit (HOSPITAL_COMMUNITY): Payer: PRIVATE HEALTH INSURANCE

## 2021-06-20 ENCOUNTER — Inpatient Hospital Stay: Admission: RE | Admit: 2021-06-20 | Payer: PRIVATE HEALTH INSURANCE | Source: Ambulatory Visit

## 2021-07-19 ENCOUNTER — Ambulatory Visit: Payer: No Typology Code available for payment source | Admitting: Nurse Practitioner

## 2021-07-19 NOTE — Progress Notes (Deleted)
Subjective:    Patient ID: Latoya [name], female    DOB: [birth year], 50 y.o.   MRN: [id number]  HPI: Latoya [name] is a 50 y.o. female presenting for diabetes follow up.  No chief complaint on file.   No Known Allergies  Outpatient Encounter Medications as of 07/19/2021  Medication Sig   atorvastatin (LIPITOR) 20 MG tablet Take 1 tablet (20 mg total) by mouth daily.   Blood Glucose Monitoring Suppl (BLOOD GLUCOSE SYSTEM PAK) KIT Please dispense based on patient and insurance preference. Use as directed to monitor FSBS 2x daily. Dx: E11.65.   glipiZIDE (GLUCOTROL) 5 MG tablet Take 1 tablet (5 mg total) by mouth daily. Take before biggest meal of day   Glucose Blood (BLOOD GLUCOSE TEST STRIPS) STRP Please dispense based on patient and insurance preference. Use as directed to monitor FSBS 2x daily. Dx: E11.65.   Lancets MISC Please dispense Accu-Chek SoftClix. Use as directed to monitor FSBS 2x daily. Dx: E11.65.   metFORMIN (GLUCOPHAGE) 500 MG tablet Take 2 tablets (1,000 mg total) by mouth 2 (two) times daily with a meal.   metoprolol tartrate (LOPRESSOR) 100 MG tablet Take one tablet by mouth 2 hours prior to CT Scan   Multiple Vitamins-Minerals (MULTIVITAMIN WITH MINERALS) tablet Take 1 tablet by mouth daily.   Specialty Vitamins Products (ADVANCED COLLAGEN PO) Take by mouth.   vitamin B-12 (CYANOCOBALAMIN) 100 MCG tablet Take 100 mcg by mouth daily.   No facility-administered encounter medications on file as of 07/19/2021.    Patient Active Problem List   Diagnosis Date Noted   BMI 30.0-30.9,adult 04/27/2019   Steatohepatitis, nonalcoholic 00/92/3300   Elevated LFTs 02/11/2018   Hyperlipidemia associated with type 2 diabetes mellitus ([place]) 05/29/2016   Type 2 diabetes mellitus with hyperglycemia ([place]) 05/29/2016    Past Medical History:  Diagnosis Date   Cervical dysplasia, moderate 08/10/2012   Depression    Pt states situational with health scares  with abnormal Paps, procedures, also breast lump with mammograms and ultrasounds, concerns and depression resolved with the medical problems and she was never medically treated   DM (diabetes mellitus), type 2 ([place]) 05/29/2016   History of abnormal cervical Pap smear 2016   Obesity (BMI 30.0-34.9) 02/04/2018   Discussed diet and exercise, negative net calories to reduce weight.   screening labs ordered,    Papanicolaou smear of cervix with low grade squamous intraepithelial lesion (LGSIL) 07/01/2012   "Cannot rule out a higher grade lesion" added on pathology report Rec a colpo    Steatohepatitis, nonalcoholic 7/62/2633   Dx 35/45/62 with RUQ [place] + and mildly elevated LFT's    Relevant past medical, surgical, family and social history reviewed and updated as indicated. Interim medical history since our last visit reviewed.  Review of Systems Per HPI unless specifically indicated above     Objective:    LMP 09/25/2016   Wt Readings from Last 3 Encounters:  04/10/21 168 lb 9.6 oz (76.5 kg)  04/06/21 165 lb 3.2 oz (74.9 kg)  01/10/21 168 lb 6.4 oz (76.4 kg)    Physical Exam  Results for orders placed or performed in visit on 04/16/21  Hemoglobin A1c  Result Value Ref Range   Hgb A1c MFr Bld 10.1 (H) <5.7 % of total Hgb   Mean Plasma Glucose 243 mg/dL   eAG (mmol/L) 13.5 mmol/L  COMPLETE METABOLIC PANEL WITH GFR  Result Value Ref Range   Glucose, Bld 215 (H) 65 -  99 mg/dL   BUN 8 7 - 25 mg/dL   Creat 0.59 0.50 - 1.03 mg/dL   eGFR 108 > OR = 60 mL/min/1.48m   BUN/Creatinine Ratio NOT APPLICABLE 6 - 22 (calc)   Sodium 140 135 - 146 mmol/L   Potassium 4.1 3.5 - 5.3 mmol/L   Chloride 105 98 - 110 mmol/L   CO2 25 20 - 32 mmol/L   Calcium 8.9 8.6 - 10.4 mg/dL   Total Protein 7.0 6.1 - 8.1 g/dL   Albumin 4.3 3.6 - 5.1 g/dL   Globulin 2.7 1.9 - 3.7 g/dL (calc)   AG Ratio 1.6 1.0 - 2.5 (calc)   Total Bilirubin 0.4 0.2 - 1.2 mg/dL   Alkaline phosphatase (APISO) 114 37 - 153 U/L   AST  19 10 - 35 U/L   ALT 29 6 - 29 U/L  Lipid panel  Result Value Ref Range   Cholesterol 116 <200 mg/dL   HDL 42 (L) > OR = 50 mg/dL   Triglycerides 94 <150 mg/dL   LDL Cholesterol (Calc) 56 mg/dL (calc)   Total CHOL/HDL Ratio 2.8 <5.0 (calc)   Non-HDL Cholesterol (Calc) 74 <130 mg/dL (calc)  SPECIMEN COMPROMISED  Result Value Ref Range   Specimen Integrity        Assessment & Plan:   Problem List Items Addressed This Visit   None    Follow up plan: No follow-ups on file.

## 2021-11-30 ENCOUNTER — Other Ambulatory Visit: Payer: Self-pay | Admitting: Nurse Practitioner
